# Patient Record
Sex: Male | Born: 1955 | Race: White | Hispanic: No | Marital: Married | Smoking: Current every day smoker
Health system: Southern US, Community
[De-identification: ages and names within clinical notes are randomized; demographics above are authoritative.]

## PROBLEM LIST (undated history)

## (undated) DIAGNOSIS — I679 Cerebrovascular disease, unspecified: Secondary | ICD-10-CM

## (undated) DIAGNOSIS — I639 Cerebral infarction, unspecified: Secondary | ICD-10-CM

## (undated) DIAGNOSIS — E119 Type 2 diabetes mellitus without complications: Secondary | ICD-10-CM

## (undated) DIAGNOSIS — F32A Depression, unspecified: Secondary | ICD-10-CM

## (undated) DIAGNOSIS — G8929 Other chronic pain: Secondary | ICD-10-CM

## (undated) DIAGNOSIS — I509 Heart failure, unspecified: Secondary | ICD-10-CM

## (undated) DIAGNOSIS — F329 Major depressive disorder, single episode, unspecified: Secondary | ICD-10-CM

## (undated) DIAGNOSIS — I10 Essential (primary) hypertension: Secondary | ICD-10-CM

## (undated) DIAGNOSIS — F419 Anxiety disorder, unspecified: Secondary | ICD-10-CM

## (undated) DIAGNOSIS — I2510 Atherosclerotic heart disease of native coronary artery without angina pectoris: Secondary | ICD-10-CM

## (undated) DIAGNOSIS — E785 Hyperlipidemia, unspecified: Secondary | ICD-10-CM

## (undated) DIAGNOSIS — M1990 Unspecified osteoarthritis, unspecified site: Secondary | ICD-10-CM

## (undated) DIAGNOSIS — A4102 Sepsis due to Methicillin resistant Staphylococcus aureus: Secondary | ICD-10-CM

## (undated) DIAGNOSIS — J449 Chronic obstructive pulmonary disease, unspecified: Secondary | ICD-10-CM

## (undated) DIAGNOSIS — K5792 Diverticulitis of intestine, part unspecified, without perforation or abscess without bleeding: Secondary | ICD-10-CM

## (undated) DIAGNOSIS — I428 Other cardiomyopathies: Secondary | ICD-10-CM

## (undated) HISTORY — DX: Sepsis due to methicillin resistant Staphylococcus aureus: A41.02

## (undated) HISTORY — DX: Other chronic pain: G89.29

## (undated) HISTORY — DX: Chronic obstructive pulmonary disease, unspecified: J44.9

## (undated) HISTORY — DX: Essential (primary) hypertension: I10

## (undated) HISTORY — DX: Cerebrovascular disease, unspecified: I67.9

## (undated) HISTORY — DX: Depression, unspecified: F32.A

## (undated) HISTORY — DX: Hyperlipidemia, unspecified: E78.5

## (undated) HISTORY — DX: Type 2 diabetes mellitus without complications: E11.9

## (undated) HISTORY — DX: Atherosclerotic heart disease of native coronary artery without angina pectoris: I25.10

## (undated) HISTORY — DX: Other cardiomyopathies: I42.8

## (undated) HISTORY — DX: Diverticulitis of intestine, part unspecified, without perforation or abscess without bleeding: K57.92

## (undated) HISTORY — DX: Anxiety disorder, unspecified: F41.9

## (undated) HISTORY — DX: Major depressive disorder, single episode, unspecified: F32.9

---

## 2001-06-20 ENCOUNTER — Encounter: Payer: Self-pay | Admitting: Family Medicine

## 2001-06-20 ENCOUNTER — Ambulatory Visit (HOSPITAL_COMMUNITY): Admission: RE | Admit: 2001-06-20 | Discharge: 2001-06-20 | Payer: Self-pay | Admitting: Family Medicine

## 2002-10-10 ENCOUNTER — Ambulatory Visit (HOSPITAL_COMMUNITY): Admission: RE | Admit: 2002-10-10 | Discharge: 2002-10-10 | Payer: Self-pay | Admitting: Family Medicine

## 2002-10-10 ENCOUNTER — Encounter: Payer: Self-pay | Admitting: Family Medicine

## 2002-11-21 HISTORY — PX: ABDOMINAL SURGERY: SHX537

## 2003-01-27 ENCOUNTER — Inpatient Hospital Stay (HOSPITAL_COMMUNITY): Admission: AD | Admit: 2003-01-27 | Discharge: 2003-01-30 | Payer: Self-pay | Admitting: Family Medicine

## 2003-01-27 ENCOUNTER — Encounter: Payer: Self-pay | Admitting: Family Medicine

## 2003-04-10 ENCOUNTER — Encounter: Payer: Self-pay | Admitting: General Surgery

## 2003-04-10 ENCOUNTER — Ambulatory Visit (HOSPITAL_COMMUNITY): Admission: RE | Admit: 2003-04-10 | Discharge: 2003-04-10 | Payer: Self-pay | Admitting: General Surgery

## 2003-05-06 ENCOUNTER — Inpatient Hospital Stay (HOSPITAL_COMMUNITY): Admission: AD | Admit: 2003-05-06 | Discharge: 2003-05-16 | Payer: Self-pay | Admitting: General Surgery

## 2003-05-07 ENCOUNTER — Encounter: Payer: Self-pay | Admitting: Anesthesiology

## 2003-05-09 ENCOUNTER — Encounter: Payer: Self-pay | Admitting: General Surgery

## 2003-05-12 ENCOUNTER — Encounter: Payer: Self-pay | Admitting: General Surgery

## 2003-05-15 ENCOUNTER — Encounter: Payer: Self-pay | Admitting: General Surgery

## 2003-08-04 ENCOUNTER — Observation Stay (HOSPITAL_COMMUNITY): Admission: AD | Admit: 2003-08-04 | Discharge: 2003-08-05 | Payer: Self-pay | Admitting: General Surgery

## 2003-08-06 ENCOUNTER — Encounter (HOSPITAL_COMMUNITY): Admission: RE | Admit: 2003-08-06 | Discharge: 2003-08-21 | Payer: Self-pay | Admitting: General Surgery

## 2003-09-10 ENCOUNTER — Ambulatory Visit (HOSPITAL_COMMUNITY): Admission: RE | Admit: 2003-09-10 | Discharge: 2003-09-10 | Payer: Self-pay | Admitting: Family Medicine

## 2003-09-10 ENCOUNTER — Encounter: Payer: Self-pay | Admitting: Family Medicine

## 2003-11-22 HISTORY — PX: OTHER SURGICAL HISTORY: SHX169

## 2003-11-22 HISTORY — PX: CARDIAC DEFIBRILLATOR PLACEMENT: SHX171

## 2004-02-11 ENCOUNTER — Ambulatory Visit (HOSPITAL_COMMUNITY): Admission: RE | Admit: 2004-02-11 | Discharge: 2004-02-11 | Payer: Self-pay | Admitting: Internal Medicine

## 2004-04-06 ENCOUNTER — Inpatient Hospital Stay (HOSPITAL_COMMUNITY): Admission: AD | Admit: 2004-04-06 | Discharge: 2004-04-14 | Payer: Self-pay | Admitting: Family Medicine

## 2004-04-15 ENCOUNTER — Encounter (HOSPITAL_COMMUNITY): Admission: RE | Admit: 2004-04-15 | Discharge: 2004-05-15 | Payer: Self-pay | Admitting: Family Medicine

## 2004-06-24 ENCOUNTER — Encounter (HOSPITAL_COMMUNITY): Admission: RE | Admit: 2004-06-24 | Discharge: 2004-07-24 | Payer: Self-pay | Admitting: *Deleted

## 2004-07-28 ENCOUNTER — Encounter (HOSPITAL_COMMUNITY): Admission: RE | Admit: 2004-07-28 | Discharge: 2004-08-20 | Payer: Self-pay | Admitting: *Deleted

## 2004-10-28 ENCOUNTER — Ambulatory Visit (HOSPITAL_COMMUNITY): Admission: RE | Admit: 2004-10-28 | Discharge: 2004-10-28 | Payer: Self-pay | Admitting: Family Medicine

## 2004-11-04 ENCOUNTER — Inpatient Hospital Stay (HOSPITAL_COMMUNITY): Admission: RE | Admit: 2004-11-04 | Discharge: 2004-11-05 | Payer: Self-pay | Admitting: *Deleted

## 2005-09-16 ENCOUNTER — Ambulatory Visit (HOSPITAL_COMMUNITY): Admission: RE | Admit: 2005-09-16 | Discharge: 2005-09-16 | Payer: Self-pay | Admitting: *Deleted

## 2008-11-21 HISTORY — PX: OTHER SURGICAL HISTORY: SHX169

## 2009-01-05 ENCOUNTER — Ambulatory Visit (HOSPITAL_COMMUNITY): Admission: RE | Admit: 2009-01-05 | Discharge: 2009-01-05 | Payer: Self-pay | Admitting: Family Medicine

## 2009-04-08 ENCOUNTER — Emergency Department (HOSPITAL_COMMUNITY): Admission: EM | Admit: 2009-04-08 | Discharge: 2009-04-08 | Payer: Self-pay | Admitting: Emergency Medicine

## 2009-04-16 ENCOUNTER — Ambulatory Visit: Payer: Self-pay | Admitting: Internal Medicine

## 2009-04-16 ENCOUNTER — Inpatient Hospital Stay (HOSPITAL_COMMUNITY): Admission: EM | Admit: 2009-04-16 | Discharge: 2009-04-20 | Payer: Self-pay | Admitting: Emergency Medicine

## 2009-04-17 ENCOUNTER — Ambulatory Visit: Payer: Self-pay | Admitting: Internal Medicine

## 2009-04-19 ENCOUNTER — Ambulatory Visit: Payer: Self-pay | Admitting: Gastroenterology

## 2009-04-22 ENCOUNTER — Encounter: Payer: Self-pay | Admitting: Internal Medicine

## 2009-09-21 ENCOUNTER — Ambulatory Visit (HOSPITAL_COMMUNITY): Admission: RE | Admit: 2009-09-21 | Discharge: 2009-09-21 | Payer: Self-pay | Admitting: Family Medicine

## 2009-09-23 ENCOUNTER — Inpatient Hospital Stay (HOSPITAL_COMMUNITY): Admission: EM | Admit: 2009-09-23 | Discharge: 2009-09-24 | Payer: Self-pay | Admitting: Emergency Medicine

## 2009-09-25 ENCOUNTER — Encounter (HOSPITAL_COMMUNITY): Admission: RE | Admit: 2009-09-25 | Discharge: 2009-10-25 | Payer: Self-pay | Admitting: Family Medicine

## 2009-09-27 ENCOUNTER — Inpatient Hospital Stay (HOSPITAL_COMMUNITY): Admission: EM | Admit: 2009-09-27 | Discharge: 2009-10-05 | Payer: Self-pay | Admitting: Emergency Medicine

## 2009-09-28 ENCOUNTER — Encounter (INDEPENDENT_AMBULATORY_CARE_PROVIDER_SITE_OTHER): Payer: Self-pay | Admitting: Internal Medicine

## 2009-09-28 ENCOUNTER — Ambulatory Visit: Payer: Self-pay | Admitting: Physical Medicine & Rehabilitation

## 2009-09-30 ENCOUNTER — Encounter (INDEPENDENT_AMBULATORY_CARE_PROVIDER_SITE_OTHER): Payer: Self-pay | Admitting: Neurology

## 2009-10-12 ENCOUNTER — Encounter (INDEPENDENT_AMBULATORY_CARE_PROVIDER_SITE_OTHER): Payer: Self-pay | Admitting: *Deleted

## 2009-10-12 LAB — CONVERTED CEMR LAB
ALT: 12 units/L
Cholesterol: 80 mg/dL
Creatinine, Ser: 1.07 mg/dL
HDL: 22 mg/dL
LDL Cholesterol: 17 mg/dL
Triglycerides: 203 mg/dL

## 2009-11-21 HISTORY — PX: OTHER SURGICAL HISTORY: SHX169

## 2009-12-14 ENCOUNTER — Encounter: Payer: Self-pay | Admitting: Orthopedic Surgery

## 2009-12-14 ENCOUNTER — Emergency Department (HOSPITAL_COMMUNITY): Admission: EM | Admit: 2009-12-14 | Discharge: 2009-12-14 | Payer: Self-pay | Admitting: Emergency Medicine

## 2009-12-22 ENCOUNTER — Ambulatory Visit: Payer: Self-pay | Admitting: Orthopedic Surgery

## 2009-12-22 DIAGNOSIS — I69959 Hemiplegia and hemiparesis following unspecified cerebrovascular disease affecting unspecified side: Secondary | ICD-10-CM | POA: Insufficient documentation

## 2009-12-22 DIAGNOSIS — M7580 Other shoulder lesions, unspecified shoulder: Secondary | ICD-10-CM

## 2009-12-22 DIAGNOSIS — M25519 Pain in unspecified shoulder: Secondary | ICD-10-CM

## 2009-12-28 ENCOUNTER — Ambulatory Visit: Admission: RE | Admit: 2009-12-28 | Discharge: 2009-12-28 | Payer: Self-pay | Admitting: Orthopedic Surgery

## 2009-12-28 ENCOUNTER — Encounter: Payer: Self-pay | Admitting: Orthopedic Surgery

## 2009-12-31 ENCOUNTER — Encounter (HOSPITAL_COMMUNITY): Admission: RE | Admit: 2009-12-31 | Discharge: 2010-01-30 | Payer: Self-pay | Admitting: Family Medicine

## 2010-02-01 ENCOUNTER — Encounter (HOSPITAL_COMMUNITY): Admission: RE | Admit: 2010-02-01 | Discharge: 2010-03-03 | Payer: Self-pay | Admitting: Family Medicine

## 2010-02-09 ENCOUNTER — Emergency Department (HOSPITAL_COMMUNITY): Admission: EM | Admit: 2010-02-09 | Discharge: 2010-02-09 | Payer: Self-pay | Admitting: Emergency Medicine

## 2010-02-17 ENCOUNTER — Ambulatory Visit (HOSPITAL_COMMUNITY): Admission: RE | Admit: 2010-02-17 | Discharge: 2010-02-17 | Payer: Self-pay | Admitting: Orthopedic Surgery

## 2010-03-26 ENCOUNTER — Ambulatory Visit: Payer: Self-pay | Admitting: Cardiology

## 2010-03-26 DIAGNOSIS — E782 Mixed hyperlipidemia: Secondary | ICD-10-CM

## 2010-03-26 DIAGNOSIS — I429 Cardiomyopathy, unspecified: Secondary | ICD-10-CM | POA: Insufficient documentation

## 2010-03-26 DIAGNOSIS — I2510 Atherosclerotic heart disease of native coronary artery without angina pectoris: Secondary | ICD-10-CM | POA: Insufficient documentation

## 2010-03-31 ENCOUNTER — Ambulatory Visit: Payer: Self-pay | Admitting: Cardiology

## 2010-03-31 ENCOUNTER — Ambulatory Visit (HOSPITAL_COMMUNITY): Admission: RE | Admit: 2010-03-31 | Discharge: 2010-03-31 | Payer: Self-pay | Admitting: Cardiology

## 2010-03-31 ENCOUNTER — Encounter: Payer: Self-pay | Admitting: Cardiology

## 2010-04-02 ENCOUNTER — Encounter: Payer: Self-pay | Admitting: Internal Medicine

## 2010-04-02 ENCOUNTER — Telehealth (INDEPENDENT_AMBULATORY_CARE_PROVIDER_SITE_OTHER): Payer: Self-pay | Admitting: *Deleted

## 2010-04-05 ENCOUNTER — Encounter: Payer: Self-pay | Admitting: Cardiology

## 2010-04-05 ENCOUNTER — Encounter: Payer: Self-pay | Admitting: Orthopedic Surgery

## 2010-04-05 ENCOUNTER — Encounter (INDEPENDENT_AMBULATORY_CARE_PROVIDER_SITE_OTHER): Payer: Self-pay | Admitting: *Deleted

## 2010-04-06 ENCOUNTER — Encounter (INDEPENDENT_AMBULATORY_CARE_PROVIDER_SITE_OTHER): Payer: Self-pay | Admitting: *Deleted

## 2010-04-07 ENCOUNTER — Ambulatory Visit: Payer: Self-pay | Admitting: Internal Medicine

## 2010-04-07 ENCOUNTER — Encounter (INDEPENDENT_AMBULATORY_CARE_PROVIDER_SITE_OTHER): Payer: Self-pay | Admitting: *Deleted

## 2010-04-07 DIAGNOSIS — Z95810 Presence of automatic (implantable) cardiac defibrillator: Secondary | ICD-10-CM | POA: Insufficient documentation

## 2010-05-14 ENCOUNTER — Ambulatory Visit (HOSPITAL_COMMUNITY)
Admission: RE | Admit: 2010-05-14 | Discharge: 2010-05-15 | Payer: Self-pay | Source: Home / Self Care | Admitting: Specialist

## 2010-06-23 ENCOUNTER — Encounter (INDEPENDENT_AMBULATORY_CARE_PROVIDER_SITE_OTHER): Payer: Self-pay | Admitting: *Deleted

## 2010-07-16 ENCOUNTER — Ambulatory Visit: Payer: Self-pay | Admitting: Cardiology

## 2010-07-16 DIAGNOSIS — I10 Essential (primary) hypertension: Secondary | ICD-10-CM

## 2010-07-22 ENCOUNTER — Encounter: Payer: Self-pay | Admitting: Internal Medicine

## 2010-07-22 ENCOUNTER — Ambulatory Visit: Payer: Self-pay | Admitting: Cardiology

## 2010-11-03 ENCOUNTER — Ambulatory Visit: Payer: Self-pay | Admitting: Cardiology

## 2010-11-05 ENCOUNTER — Encounter (INDEPENDENT_AMBULATORY_CARE_PROVIDER_SITE_OTHER): Payer: Self-pay | Admitting: *Deleted

## 2010-11-05 ENCOUNTER — Ambulatory Visit: Payer: Self-pay | Admitting: Internal Medicine

## 2010-11-09 ENCOUNTER — Inpatient Hospital Stay (HOSPITAL_COMMUNITY): Admission: EM | Admit: 2010-11-09 | Discharge: 2010-11-10 | Payer: Self-pay | Source: Home / Self Care

## 2010-11-21 ENCOUNTER — Emergency Department (HOSPITAL_COMMUNITY)
Admission: EM | Admit: 2010-11-21 | Discharge: 2010-11-21 | Payer: Self-pay | Source: Home / Self Care | Admitting: Emergency Medicine

## 2010-12-06 ENCOUNTER — Ambulatory Visit
Admission: RE | Admit: 2010-12-06 | Discharge: 2010-12-06 | Payer: Self-pay | Source: Home / Self Care | Attending: Cardiology | Admitting: Cardiology

## 2010-12-06 ENCOUNTER — Encounter (INDEPENDENT_AMBULATORY_CARE_PROVIDER_SITE_OTHER): Payer: Self-pay | Admitting: *Deleted

## 2010-12-08 ENCOUNTER — Encounter (INDEPENDENT_AMBULATORY_CARE_PROVIDER_SITE_OTHER): Payer: Self-pay | Admitting: *Deleted

## 2010-12-21 NOTE — Assessment & Plan Note (Signed)
Summary: per Dr.McGough for pre-surgical evaluation/tg   Visit Type:  Initial Consult Referring Provider:  Dr. Valma Cava Primary Provider:  Dr. Karleen Hampshire   History of Present Illness: 55 year old male referred for cardiology consultation. He is a former patient of [employer], [employer], Dr. Domingo Sep, and has had no regular cardiology followup the last few years it seems. His history is reviewed below. He is here with his wife today for a preoperative consultation prior to elective left rotator cuff surgery, type of anesthesia not yet chosen.  At baseline Peter Long denies any anginal chest pain, and has NYHA class II dyspnea on exertion. 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 He uses a cane for support with his right arm. He has limited mobility of his left arm.  He reports compliance with his medications which are outlined below. He has not had any followup ischemic testing over the last several years, stating that he had an outpatient diagnostic cardiac catheterization done back somewhere around 2005 are 2006 (report not available at this time). He states that at that time he was told he had only "mild plaque."  Most recent assessment of LVEF is by TEE done in November 2010, and reviewed below.  Peter Long also indicates that he has had no regular followup of his St. Jude defibrillator. He states that he has never had a shock from this device.  Clinical Review Panels:  Echocardiogram Echocardiogram   Study Conclusions    1. Left ventricle: Systolic function was normal. The estimated      ejection fraction was in the range of 50% to 55%.   2. Aortic valve: Trivial regurgitation.   3. Mitral valve: Mildly calcified annulus. Mild regurgitation.   4. Left atrium: The atrium was mildly dilated. No evidence of      thrombus in the appendage.   5. Right atrium: The atrium was mildly dilated. No evidence of  thrombus in the atrial cavity or appendage.   6. Atrial septum: The septum bowed from right to left, consistent      with increased right atrial pressure. No defect or patent foramen      ovale was identified. There was an atrial septal aneurysm. Saline      contrast study negative for PFO or ASD. No evidence of thrombus.   Impressions:    - No cardiac source of emboli was indentified.   Transesophageal echocardiography. 2D and color Doppler. Patient   status: Inpatient. Location: Endoscopy. (09/30/2009)    Current Medications (verified): 1)  Oxycodone Hcl 15 Mg Tabs (Oxycodone Hcl) .... Take As Needed 2)  Bupropion Hcl 150 Mg Xr12h-Tab (Bupropion Hcl) .... Take 1 Tab Two Times A Day 3)  Digoxin 0.125 Mg Tabs (Digoxin) .... Take 1 Tab Daily 4)  Simvastatin 40 Mg Tabs (Simvastatin) .... Take 1 Tab Daily 5)  Gemfibrozil 600 Mg Tabs (Gemfibrozil) .... Take 1 Tab Two Times A Day 6)  Mirtazapine 15 Mg Tabs (Mirtazapine) .... Take 1 Tab At Bedtime 7)  Lisinopril 10 Mg Tabs (Lisinopril) .... Take 1 Tab Daily 8)  Citalopram Hydrobromide 40 Mg Tabs (Citalopram Hydrobromide) .... Take 1 Tab Daily 9)  Carvedilol 25 Mg Tabs (Carvedilol) .... Take 1 Tab Two Times A Day 10)  Metformin Hcl 1000 Mg Tabs (Metformin Hcl) .... Take 1tab Two Times A Day 11)  Aleve 220 Mg Tabs (Naproxen Sodium) .... Take As Needed 12)  Lantus 100 Unit/ml Soln (Insulin Glargine) .... 72  Units At Bedtime  Allergies (verified): 1)  ! Morphine  Past History:  Past Medical History: Last updated: 03/24/2010 Chronic pain Diverticulitis C O P D Diabetes Type 2 Hyperlipidemia Hypertension Anxiety Depression Cerebrovascular Disease Nonischemic cardiomyopathy - LVEF improved to 50-55% by TEE 11/10 [facility]) MRSA sepsis CAD - possible, poorly documented  Past Surgical History: Last updated: 03/24/2010 Right gluteal abscess drainage 2005 Abdominal surgery 2004 ICD implantation 12/05 [facility] - Dr. Severiano Gilbert), St Jude Atlas  Plus VR ERCP with stone extraction 5/10  Family History: Last updated: 03/24/2010 FH of Cancer Family History of Diabetes Family History Coronary Heart Disease male < 44  Social History: Last updated: 03/24/2010 Patient is married Tobacco Use - Yes Alcohol Use - no  Review of Systems  The patient denies anorexia, fever, weight loss, chest pain, dyspnea on exertion, peripheral edema, prolonged cough, melena, hematochezia, and severe indigestion/heartburn.         Complains of left shoulder pain with reduced range of motion. Also some right shoulder pain using a cane. No dizziness, palpitations, or syncope. Appetite stable. Otherwise reviewed and negative.  Vital Signs:  Patient profile:   55 year old male Height:      72 inches Weight:      222 pounds BMI:     30.22 Pulse rate:   81 / minute BP sitting:   154 / 78  (right arm)  Vitals Entered By: Dreama Saa, CNA (Mar 26, 2010 11:25 AM)  Physical Exam  Additional Exam:  Obese, chronically ill-appearing male, in no acute distress. HEENT: Conjunctiva and lids normal, oropharynx with poor dentition. Neck: Supple, no elevated JVP or loud carotid bruits. Lungs: Diminished breath sounds, non-labored, no wheezing. Cardiac: Regular rate and rhythm, indistinct PMI, no S3 gallop or loud systolic murmur. Thorax: Well-healed device pocket site on the right. Abdomen: Obese, unable to palpate liver edge, bowel sounds present, no tenderness. Extremities: No pitting edema, distal pulses one plus, some venous stasis. Skin: Warm and dry, scattered tattoos. Musculoskeletal: No gross deformities. Neuropsychiatric: Alert and oriented x3, affect somewhat guarded. Muscle strength 3-4/5 left versus normal on the right.   EKG  Procedure date:  03/26/2010  Findings:      Normal sinus rhythm at 81 beats per minutes with a nonspecific IVCD, QRS duration 122 ms, nonspecific ST changes.  Impression & Recommendations:  Problem # 1:   PREOPERATIVE EXAMINATION (ICD-V72.84)  Preoperative evaluation in a 55 year old male with history of nonischemic cardiomyopathy, subsequent improvement in LVEF is 50-55% as of November 2010, status post prior defibrillator placement for prophylaxis, hypertension, hyperlipidemia, and potentially only mild coronary atherosclerosis based on very limited information. He also has a history of cerebrovascular disease status post stroke with residual left-sided weakness since November 2010. He was previously followed by [facility], although has had no regular cardiology followup for years. He reports no anginal symptoms, and stable NYHA class II dyspnea exertion, with no device discharges, and a nonspecific resting electrocardiogram. I have requested records regarding the patient's reported prior outpatient cardiac catheterization. Given the length of time since formal ischemic testing, we will also arrange a Lexiscan Myoview on medical therapy to better understand his perioperative risk. Based on this along with reassessment of LVEF by 2-D echocardiography, we can make a final recommendation regarding his perioperative cardiovascular risk. Otherwise will arrange followup for him routinely in 3 months, and also establish him in our device clinic for followup of his St. Jude defibrillator. No medication changes were made today.  Problem # 2:  CORONARY ATHEROSCLEROSIS NATIVE CORONARY ARTERY (ICD-414.01)  Reportedly mild and nonobstructive based on very limited information. We will request records regarding his prior cardiac catheterization.  His updated medication list for this problem includes:    Lisinopril 10 Mg Tabs (Lisinopril) .Marland Kitchen... Take 1 tab daily    Carvedilol 25 Mg Tabs (Carvedilol) .Marland Kitchen... Take 1 tab two times a day  Problem # 3:  MIXED HYPERLIPIDEMIA (ICD-272.2)  On reasonable therapy, followed by [name].  His updated medication list for this problem includes:     Simvastatin 40 Mg Tabs (Simvastatin) .Marland Kitchen... Take 1 tab daily    Gemfibrozil 600 Mg Tabs (Gemfibrozil) .Marland Kitchen... Take 1 tab two times a day  Problem # 4:  UNSPECIFIED SECONDARY CARDIOMYOPATHY (ICD-425.9)  Presumably nonischemic cardiomyopathy, with improvement in LVEF to the range of 50-55% as of November 2010. This will be reassessed with a repeat 2-D echocardiogram.  His updated medication list for this problem includes:    Digoxin 0.125 Mg Tabs (Digoxin) .Marland Kitchen... Take 1 tab daily    Lisinopril 10 Mg Tabs (Lisinopril) .Marland Kitchen... Take 1 tab daily    Carvedilol 25 Mg Tabs (Carvedilol) .Marland Kitchen... Take 1 tab two times a day  Patient Instructions: 1)  Your physician recommends that you schedule a follow-up appointment in: 3 months 2)  Your physician recommends that you continue on your current medications as directed. Please refer to the Current Medication list given to you today. 3)  Your physician has requested that you have an echocardiogram.  Echocardiography is a painless test that uses sound waves to create images of your heart. It provides your doctor with information about the size and shape of your heart and how well your heart's chambers and valves are working.  This procedure takes approximately one hour. There are no restrictions for this procedure. 4)  Your physician has requested that you have an [employer]  For further information please visit [email or web address]  Please follow instruction sheet, as given. 5)  You have been referred to our EP clinic and will be given appt. before leaving office today.  Appended Document: per Dr.McGough for pre-surgical evaluation/tg Reviewed recent 2D echo and Myoview reports as well as prior records from [facility].  Cardiac catherization in 2005 by Dr. Elsie Lincoln revealed essentially normal coronaries and patient has documented history of nonischemic cardiomyopathy, LVEF ranging from 10% to 50% based on various studies.  Most consistent LVEF in 25-30% range which is  similar to recent 2D echo.  Myoview without an large degree of ischemia.  He should therefore be able to proceed with planned surgery on medical therapy at a moderate perioperative cardiovascular risk.  He is to establish follow-up in our device clinic as well (St. Jude ICD placed by [facility]).  Appended Document: per Dr.McGough for pre-surgical evaluation/tg clearance letter faxed to Dr. Benny Lennert

## 2010-12-21 NOTE — Miscellaneous (Signed)
Summary: Device preload  Clinical Lists Changes  Observations: Added new observation of ICD INDICATN: CM (04/02/2010 14:10) Added new observation of ICDLEADSTAT2: active (04/02/2010 14:10) Added new observation of ICDLEADSER2: [id number] (04/02/2010 14:10) Added new observation of ICDLEADMOD2: 1570  (04/02/2010 14:10) Added new observation of ICDLEADDOI2: 11/04/2004  (04/02/2010 14:10) Added new observation of ICDLEADLOC2: RV  (04/02/2010 14:10) Added new observation of ICDLEADSTAT1: active  (04/02/2010 14:10) Added new observation of ICDLEADSER1: [id number]  (04/02/2010 14:10) Added new observation of ICDLEADMOD1: 6937  (04/02/2010 14:10) Added new observation of ICDLEADDOI1: 11/04/2004  (04/02/2010 14:10) Added new observation of ICDLEADLOC1: RV  (04/02/2010 14:10) Added new observation of ICD IMP MD: NOT IMPLANTED BY [place]  (04/02/2010 14:10) Added new observation of ICD IMPL DTE: 11/04/2004  (04/02/2010 14:10) Added new observation of ICD SERL#: [id number]  (04/02/2010 14:10) Added new observation of ICD MODL#: V193  (04/02/2010 14:10) Added new observation of ICDMANUFACTR: St Jude  (04/02/2010 14:10) Added new observation of ICD MD: Lewayne Bunting, MD  (04/02/2010 14:10)       ICD Specifications Following MD:  Lewayne Bunting, MD     ICD Vendor:  St Jude     ICD Model Number:  [phone]     ICD Serial Number:  [id number] ICD DOI:  11/04/2004     ICD Implanting MD:  NOT IMPLANTED BY [place]  Lead 1:    Location: RV     DOI: 11/04/2004     Model #: 3875     Serial #: [id number]     Status: active Lead 2:    Location: RV     DOI: 11/04/2004     Model #: 1570     Serial #: [id number]     Status: active  Indications::  CM

## 2010-12-21 NOTE — Assessment & Plan Note (Signed)
Summary: [name] [facility] 12/14/09/REF MCGOUGH/[employer] MEDICARE...   Vital Signs:  Patient profile:   55 year old male Weight:      229 pounds Pulse rate:   68 / minute Resp:     16 per minute  Visit Type:  Initial Consult Primary Provider:  Dr.McGough  CC:  left shoulder pain.  History of Present Illness: Dr. Romeo Apple dictating on Peter Long DR Karleen Hampshire  The patient had a cerebrovascular accident with left-sided paresis in November fell in or on January 21.  He's been having LEFT hip pain LEFT shoulder pain LEFT wrist pain with LEFT wrist swelling and discoloration of the upper extremity   he describes bilateral shoulder pain mostly in the back with some hip pain.  He notes spurs had been seen on x-ray.  His shoulder pain is sharp stabbing constant it started gradually its worse when he lies down nothing makes it better including hydrocodone 10 mg which only lasted 2 minutes and oxycodone.  His pain is severe, he rates it an 8/10  Meds: Hydrocodone 10, Hydromorphone, Diazepam, Lisinopril, Carvedilol, Gemfibrozil, Citalopram, Mirtazapine, Zolpidem, Metformin HCL, Simvastatin, Digoxin, Buspar.  Xrays from 12/14/09 of the left shoulder and wrist for review, [facility].    Allergies (verified): No Known Drug Allergies  Past History:  Past Medical History: chronic pain anxiety depression htn cholesterol trouble sleeping diverticulitis heart disease CHF ICD implant stroke  Past Surgical History: C5 6 and 7 neck hernia MRSA infection  Family History: FH of Cancer:  Family History of Diabetes Family History Coronary Heart Disease male < 20  Social History: Patient is married.  no smoking 1 pack per day of cigs no alcohol no caffeine  Review of Systems General:  Complains of fatigue; denies weight loss, weight gain, fever, and chills; easy bleeding and bruising. Cardiac :  Denies chest pain, angina, heart attack, heart failure, poor circulation,  blood clots, and phlebitis; ICD implant and CHF. Resp:  Complains of short of breath; denies difficulty breathing, COPD, cough, and pneumonia. GI:  Complains of diarrhea; denies nausea, vomiting, constipation, difficulty swallowing, ulcers, GERD, and reflux. GU:  Denies kidney failure, kidney transplant, kidney stones, burning, poor stream, testicular cancer, blood in urine, and . Neuro:  Complains of dizziness, numbness, and unsteady walking; denies headache, migraines, weakness, and tremor. MS:  Complains of joint pain and joint swelling; denies rheumatoid arthritis, gout, bone cancer, osteoporosis, and ; instability and stiffness and muscle pain. Endo:  Denies thyroid disease, goiter, and diabetes. Psych:  Complains of depression and anxiety; denies mood swings, panic attack, bipolar, and schizophrenia. Derm:  Complains of itching; denies eczema and cancer; poor healing. EENT:  Denies poor vision, cataracts, glaucoma, poor hearing, vertigo, ears ringing, sinusitis, hoarseness, toothaches, and bleeding gums; blurred vision and difficulty swallowing. Immunology:  Denies seasonal allergies, sinus problems, and allergic to bee stings. Lymphatic:  Denies lymph node cancer and lymph edema.  Physical Exam  Additional Exam:  vital signs her weight 229 pulse 68 respiratory 16  General he is quite a large male but with a reasonable medium-size body habitus.  He has normal pulse and perfusion in his LEFT upper extremity with good radial and ulnar pulse.  There is edema in the hand.  He has no lymphadenopathy in the axilla cervical spine of the LEFT side.  The skin is warm dry and intact with no discoloration today.  He has normal sensation in the hand and his cervical 5 root cervical 6 root and cervical  7 root all work with equal strength flexing his arm extending his arm and abducting his arm although all are weak.  He had good wrist extension against gravity and moderate extension against  resistance.  Weak finger flexors.  There is tenderness in the anterolateral deltoid area.  There was discomfort with range of motion which was decreased secondary to joint contracture and stiffness.  He had good elbow flexion against gravity and moderate against resistance mild weakness in extension at the elbow.  He had 45 of external rotation 80 of abduction 75 of forward elevation and the shoulder was stable     Impression & Recommendations:  Problem # 1:  IMPINGEMENT SYNDROME (ICD-726.2) Assessment New  Orders: Consultation Level III ([address]) Joint Aspirate / Injection, Large ([address]10) Depo- Medrol 40mg  (J1030)  Problem # 2:  SHOULDER PAIN (ICD-719.41) Assessment: New  I injected his LEFT subacromial space   Verbal consent obtained/The shoulder was injected with depomedrol 40mg /cc and sensorcaine .25% . There were no complications  Most likely he has some mild proximal migration of the humerus due to rotator cuff weakness leading to impingement syndrome and shoulder pain.  I do not see any x-ray evidence of fracture in the shoulder although it had some a.c. joint arthrosis or the wrist although he seems to have some degenerative changes on the ulnar side of the hand.  Recommend wrist splint to maintain functional position of the LEFT hand.  Continue occupational therapy.  He'll discuss his pain management with his primary care physician [name]  Orders: Consultation Level III [id number]) Joint Aspirate / Injection, Large (20610) Depo- Medrol 40mg  (J1030)  Other Orders: Physical Therapy Referral (PT)  Patient Instructions: 1)  Get wrist splint for the left wrist that will hold your wrist up from [place] we will send an order. 2)  Continue at home therapy until you are ready for outpatient therapy. 3)  Talk to PCP about pain med. 4)  No need to wear sling. 5)  come back as needed.

## 2010-12-21 NOTE — Letter (Signed)
Summary: *Orthopedic Consult Note  [facility]  [address] [place] [address]  [place], [place] [address]   Phone: [phone]  Fax: [phone]    Re:    Peter Long DOB:    1956-11-10   Dear: [name]   Thank you for requesting that we see the above patient for consultation.  A copy of the detailed office note will be sent under separate cover, for your review.  Evaluation today is consistent with: impingement syndrome and shoulder pain   Our recommendation is for: injection, functional wrist splint.  (an order for a wrist splint has been sent to the hospital).  Followup as needed       Thank you for this opportunity to look after your patient.  Sincerely,   Terrance Mass. MD.

## 2010-12-21 NOTE — Assessment & Plan Note (Signed)
Summary: NEP ST JUDE   Visit Type:  Initial Consult Referring Provider:  Dr. Valma Cava Primary Provider:  Dr. Karleen Hampshire  CC:  NO CARDIOLOGY COMPLAINTS.  History of Present Illness: Peter Long is referred today as a new EP eval. by Dr. Diona Browner.  He is a pleasant 55 yo man with a h/o DCM and CHF.  He underwent ICD implant in 2005.  He initially had improvement of his LV function but most recently 2D echo demonstrates an EF of 30%.  He has class 2 CHF.  He is mostly bothered by arthritic problems with his shoulder, knees and back and takes pain meds chronically.  He has not had any ICD shocks.  No syncope.  Current Medications (verified): 1)  Oxycodone Hcl 15 Mg Tabs (Oxycodone Hcl) .... Take As Needed 2)  Bupropion Hcl 150 Mg Xr12h-Tab (Bupropion Hcl) .... Take 1 Tab Two Times A Day 3)  Digoxin 0.125 Mg Tabs (Digoxin) .... Take 1 Tab Daily 4)  Simvastatin 40 Mg Tabs (Simvastatin) .... Take 1 Tab Daily 5)  Gemfibrozil 600 Mg Tabs (Gemfibrozil) .... Take 1 Tab Two Times A Day 6)  Mirtazapine 15 Mg Tabs (Mirtazapine) .... Take 1 Tab At Bedtime 7)  Lisinopril 10 Mg Tabs (Lisinopril) .... Take 1 Tab Daily 8)  Citalopram Hydrobromide 40 Mg Tabs (Citalopram Hydrobromide) .... Take 1 Tab Daily 9)  Carvedilol 25 Mg Tabs (Carvedilol) .... Take 1 Tab Two Times A Day 10)  Metformin Hcl 1000 Mg Tabs (Metformin Hcl) .... Take 1tab Two Times A Day 11)  Aleve 220 Mg Tabs (Naproxen Sodium) .... Take As Needed 12)  Lantus 100 Unit/ml Soln (Insulin Glargine) .... 72 Units At Bedtime  Allergies (verified): 1)  ! Morphine  Past History:  Past Medical History: Last updated: 03/24/2010 Chronic pain Diverticulitis C O P D Diabetes Type 2 Hyperlipidemia Hypertension Anxiety Depression Cerebrovascular Disease Nonischemic cardiomyopathy - LVEF improved to 50-55% by TEE 11/10 [facility]) MRSA sepsis CAD - possible, poorly documented  Past Surgical History: Last updated: 03/24/2010 Right  gluteal abscess drainage 2005 Abdominal surgery 2004 ICD implantation 12/05 [facility] - Dr. Severiano Gilbert), St Jude Atlas Plus VR ERCP with stone extraction 5/10  Family History: Last updated: 03/24/2010 FH of Cancer Family History of Diabetes Family History Coronary Heart Disease male < 78  Social History: Last updated: 03/24/2010 Patient is married Tobacco Use - Yes Alcohol Use - no  Review of Systems  The patient denies chest pain, syncope, dyspnea on exertion, and peripheral edema.    Vital Signs:  Patient profile:   55 year old male Weight:      222 pounds Pulse rate:   78 / minute BP sitting:   142 / 97  (right arm)  Vitals Entered By: Dreama Saa, CNA (Apr 07, 2010 3:46 PM)  Physical Exam  General:  Well developed, well nourished, in no acute distress. Ill appearing. HEENT: normal Neck: supple. No JVD. Carotids 2+ bilaterally no bruits Cor: RRR with normal S1 and S2.  PMI is enlarged and laterally displaced. Lungs: CTA with no wheezes or rales. Ab: soft, nontender. nondistended. No HSM. Good bowel sounds Ext: warm. no cyanosis, clubbing or edema Neuro: alert and oriented. Grossly nonfocal. affect pleasant     ICD Specifications Following MD:  Lewayne Bunting, MD     ICD Vendor:  St Jude     ICD Model Number:  [id number]     ICD Serial Number:  [id number] ICD DOI:  11/04/2004  ICD Implanting MD:  NOT IMPLANTED BY [place]  Lead 1:    Location: RV     DOI: 11/04/2004     Model #: 1610     Serial #: [id number]     Status: active Lead 2:    Location: RV     DOI: 11/04/2004     Model #: 1570     Serial #: [id number]     Status: active  Indications::  CM   ICD Follow Up Remote Check?  No Battery Voltage:  2.62 V     Charge Time:  11.1 seconds     Underlying rhythm:  SR   ICD Device Measurements Right Ventricle:  Amplitude: 12 mV, Impedance: 450 ohms, Threshold: 0.75 V at 0.5 msec  Episodes Coumadin:  No Shock:  0     ATP:  0     Nonsustained:  0     Ventricular Pacing:   <1%  Brady Parameters Mode vvi     Lower Rate Limit:  40      Tachy Zones VF:  182     Next Cardiology Appt Due:  06/21/2010 Tech Comments:  No parameter changes.  Device function normal.  ROV 3 months RDS clinic. Altha Harm, LPN  Apr 07, 2010 4:06 PM  MD Comments:  Agree with above.  Impression & Recommendations:  Problem # 1:  AUTOMATIC IMPLANTABLE CARDIAC DEFIBRILLATOR SITU (ICD-V45.02) His device is working normally.  I will recheck in several months.  Problem # 2:  UNSPECIFIED SECONDARY CARDIOMYOPATHY (ICD-425.9) His CHF is well compensated.  I have asked him to maintain a low sodium diet. He will continue his current meds. His updated medication list for this problem includes:    Digoxin 0.125 Mg Tabs (Digoxin) .Marland Kitchen... Take 1 tab daily    Lisinopril 10 Mg Tabs (Lisinopril) .Marland Kitchen... Take 1 tab daily    Carvedilol 25 Mg Tabs (Carvedilol) .Marland Kitchen... Take 1 tab two times a day  Problem # 3:  PREOPERATIVE EXAMINATION (ICD-V72.84) The patient is considering undergoing surgery.  He would be low-moderate risk for cardiovascular complications.  Patient Instructions: 1)  Your physician recommends that you schedule a follow-up appointment in: 1 year 2)  Your physician recommends that you continue on your current medications as directed. Please refer to the Current Medication list given to you today.

## 2010-12-21 NOTE — Cardiovascular Report (Signed)
Summary: Office Visit   Office Visit   Imported By: Roderic Ovens 08/17/2010 15:26:16  _____________________________________________________________________  External Attachment:    Type:   Image     Comment:   External Document

## 2010-12-21 NOTE — Letter (Signed)
Summary: [place] [facility] (Nuc Med Stress)  [facility]  [address] [place] [address]   Phone: [phone]  Fax: [phone]    Nuclear Medicine 1-Day Stress Test Information Sheet  Re:     Peter Long   DOB:     1955-12-26 MRN:     [id number] Weight:  Appointment Date: Register at: Appointment Time: Referring MD:  ___Exercise Stress  __Adenosine   __Dobutamine  _X_Lexiscan  __Persantine   __Thallium  Urgency: ____1 (next day)   ____2 (one week)    ____3 (PRN)  Patient will receive Follow Up call with results: Patient needs follow-up appointment:  Instructions regarding medication:  How to prepare for your stress test: 1. DO NOT eat or dring 6 hours prior to your arrival time. This includes no caffeine (coffee, tea, sodas, chocolate) if you were instructed to take your medications, drink water with it. 2. DO NOT use any tobacco products for at leaset 8 hours prior to arrival. 3. DO NOT wear dresses or any clothing that may have metal clasps or buttons. 4. Wear short sleeve shirts, loose clothing, and comfortalbe walking shoes. 5. DO NOT use lotions, oils or powder on your chest before the test. 6. The test will take approximately 3-4 hours from the time you arrive until completion. 7. To register the day of the test, go to the Short Stay entrance at [facility] 8. If you must cancel your test, call [phone] as soon as you are aware.  After you arrive for test:   When you arrive at [facility], you will go to Short Stay to be registered. They will then send you to Radiology to check in. The Nuclear Medicine Tech will get you and start an IV in your arm or hand. A small amount of a radioactive tracer will then be injected into your IV. This tracer will then have to circulate for 30-45 minutes. During this time you will wait in the waiting room and you will be able to drink something without caffeine. A series of pictures will be taken  of your heart follwoing this waiting period. After the 1st set of pictures you will go to the stress lab to get ready for your stress test. During the stress test, another small amount of a radioactive tracer will be injected through your IV. When the stress test is complete, there is a short rest period while your heart rate and blood pressure will be monitored. When this monitoring period is complete you will have another set of pictrues taken. (The same as the 1st set of pictures). These pictures are taken between 15 minutes and 1 hour after the stress test. The time depends on the type of stress test you had. Your doctor will inform you of your test results within 7 days after test.    The possibilities of certain changes are possible during the test. They include abnormal blood pressure and disorders of the heart. Side effects of persantine or adenosine can include flushing, chest pain, shortness of breath, stomach tightness, headache and light-headedness. These side effects usually do not last long and are self-resolving. Every effort will be made to keep you comfortable and to minimize complications by obtaining a medical history and by close observation during the test. Emergency equipment, medications, and trained personnel are available to deal with any unusual situation which may arise.  Please notify office at least 48 hours in advance if you are unable to keep  this appt.

## 2010-12-21 NOTE — Letter (Signed)
Summary: MED LIST  MED LIST   Imported By: Faythe Ghee 03/26/2010 15:49:57  _____________________________________________________________________  External Attachment:    Type:   Image     Comment:   External Document

## 2010-12-21 NOTE — Progress Notes (Signed)
Summary: Results  Phone Note Call from Patient   Caller: Patient Summary of Call: Pt would like to speak with you regarding results of stress test and if he's cleared for surgery/tg Initial call taken by: Raechel Ache [facility],  Apr 02, 2010 11:06 AM  Follow-up for Phone Call        health port [phone] I spoke with health port late Friday afternoon, His records were mailed Friday to our office.   Teressa Lower RN  Apr 05, 2010 9:56 AM   Follow-up by: Teressa Lower RN,  Apr 02, 2010 12:28 PM

## 2010-12-21 NOTE — Miscellaneous (Signed)
Summary: OT Clinical evaluation  OT Clinical evaluation   Imported By: Jacklynn Ganong 01/25/2010 10:35:11  _____________________________________________________________________  External Attachment:    Type:   Image     Comment:   External Document

## 2010-12-21 NOTE — Assessment & Plan Note (Signed)
Summary: F3M   Visit Type:  Follow-up Primary Provider:  Dr. Karleen Hampshire   History of Present Illness: 55 year old male presents for followup. I saw him in consultation back in May for preoperative clearance. History is detailed in that note. In the meanwhile I had him establish with Dr. Ladona Ridgel for device followup. He ultimately underwent arthroscopic left shoulder surgery in June.  He reports NYHA class II dyspnea on exertion, no chest pain or palpitations. States of compliance with his medications. Blood pressure is elevated today, although he said he got upset about misplacing some money shortly before coming to the office.  I reviewed his cardiac testing, detailed below. Plan at this point is for medical therapy and observation.  Current Medications (verified): 1)  Bupropion Hcl 150 Mg Xr12h-Tab (Bupropion Hcl) .... Take 1 Tab Two Times A Day 2)  Digoxin 0.125 Mg Tabs (Digoxin) .... Take 1 Tab Daily 3)  Simvastatin 40 Mg Tabs (Simvastatin) .... Take 1 Tab Daily 4)  Gemfibrozil 600 Mg Tabs (Gemfibrozil) .... Take 1 Tab Two Times A Day 5)  Mirtazapine 15 Mg Tabs (Mirtazapine) .... Take 1 Tab At Bedtime 6)  Lisinopril 10 Mg Tabs (Lisinopril) .... Take 1 Tab Daily 7)  Citalopram Hydrobromide 40 Mg Tabs (Citalopram Hydrobromide) .... Take 1 Tab Daily 8)  Carvedilol 25 Mg Tabs (Carvedilol) .... Take 1 Tab Two Times A Day 9)  Metformin Hcl 1000 Mg Tabs (Metformin Hcl) .... Take 1tab Two Times A Day 10)  Aleve 220 Mg Tabs (Naproxen Sodium) .... Take As Needed 11)  Lantus 100 Unit/ml Soln (Insulin Glargine) .... 72 Units At Bedtime 12)  Endocet 10-325 Mg Tabs (Oxycodone-Acetaminophen) .... Take As Needed  Allergies (verified): 1)  ! Morphine  Past History:  Social History: Last updated: 03/24/2010 Patient is married Tobacco Use - Yes Alcohol Use - no  Past Medical History: Chronic pain Diverticulitis C O P D Diabetes Type  2 Hyperlipidemia Hypertension Anxiety Depression Cerebrovascular Disease Nonischemic cardiomyopathy - LVEF 30-35% 5/11 MRSA sepsis CAD - possible, poorly documented  Past Surgical History: Right gluteal abscess drainage 2005 Abdominal surgery 2004 ICD implantation 12/05 [facility] - Dr. Severiano Gilbert), St Jude Atlas Plus VR ERCP with stone extraction 5/10 Arthroscopic left shoulder surgery 6/11  Review of Systems  The patient denies anorexia, fever, chest pain, syncope, dyspnea on exertion, peripheral edema, melena, and hematochezia.         Complains of chronic left-sided weakness and also left-sided hip pain. Uses a cane to ambulate. Otherwise reviewed and negative.  Vital Signs:  Patient profile:   55 year old male Height:      72 inches Weight:      220 pounds O2 Sat:      95 % on Room air Pulse rate:   91 / minute BP sitting:   157 / 96  (right arm)  Vitals Entered By: Dreama Saa, CNA (July 16, 2010 1:31 PM)  O2 Flow:  Room air  Physical Exam  Additional Exam:  Obese, chronically ill-appearing male, in no acute distress. HEENT: Conjunctiva and lids normal, oropharynx with poor dentition. Neck: Supple, no elevated JVP or loud carotid bruits. Lungs: Diminished breath sounds, non-labored, no wheezing. Cardiac: Regular rate and rhythm, indistinct PMI, no S3 gallop or loud systolic murmur. Thorax: Well-healed device pocket site on the right. Abdomen: Obese, unable to palpate liver edge, bowel sounds present, no tenderness. Extremities: No pitting edema, distal pulses one plus, some venous stasis. Neuropsychiatric: Alert and oriented x3, affect somewhat guarded.  Muscle strength 3-4/5 left versus normal on the right.   Nuclear Study  Procedure date:  03/31/2010  Findings:      IMPRESSION:   Abnormal Lexiscan Myoview, poor quality study overall however.   There were no diagnostic ST-segment changes by standard criteria.   Limitations of the study are related to soft  tissue attenuation,   poor radiotracer uptake within the myocardium in general, and arms   down imaging.  Fixed defects as described above were noted, however   there are no obvious large reversible perfusion defects to indicate   ischemia.  LVEF calculated at 27%, although appears to be   underestimated.  Left ventricular chamber size is enlarged based on   measurements.  This would be consistent with cardiomyopathy.   Cannot exclude scar from prior infarct based on this particular   study however.  Review of additional records may be of use.   Echocardiogram  Procedure date:  03/31/2010  Findings:       Study Conclusions    - Left ventricle: The cavity size was mildly dilated. Wall thickness     was increased in a pattern of mild LVH. Systolic function was     moderately to severely reduced. The estimated ejection fraction     was in the range of 30% to 35%. Diffuse hypokinesis. Probable     akinesis of the lateral myocardium. Doppler parameters are     consistent with abnormal left ventricular relaxation (grade 1     diastolic dysfunction).   - Aortic valve: Mildly thickened leaflets.   - Mitral valve: Mild regurgitation.   - Left atrium: The atrium was at the upper limits of normal in size.   - Right ventricle: Pacer wire or catheter noted in right ventricle.     Systolic function was normal.   - Tricuspid valve: Trivial regurgitation.   - Pericardium, extracardiac: A trivial pericardial effusion was     identified.   ICD Specifications Following MD:  Lewayne Bunting, MD     ICD Vendor:  [facility] Jude     ICD Model Number:  [phone]     ICD Serial Number:  [id number] ICD DOI:  11/04/2004     ICD Implanting MD:  NOT IMPLANTED BY [place]  Lead 1:    Location: RV     DOI: 11/04/2004     Model #: 2841     Serial #: [id number]     Status: active Lead 2:    Location: RV     DOI: 11/04/2004     Model #: 1570     Serial #: [id number]     Status: active  Indications::  CM   Episodes Coumadin:   No  Brady Parameters Mode vvi     Lower Rate Limit:  40      Tachy Zones VF:  182     Impression & Recommendations:  Problem # 1:  UNSPECIFIED SECONDARY CARDIOMYOPATHY (ICD-425.9)  History of nonischemic cardiomyopathy. Most recent Myoview is detailed above. At this point plan medical therapy. Patient appears euvolemic on examination. Followup in 4 months.  His updated medication list for this problem includes:    Digoxin 0.125 Mg Tabs (Digoxin) .Marland Kitchen... Take 1 tab daily    Lisinopril 10 Mg Tabs (Lisinopril) .Marland Kitchen... Take 1 tab daily    Carvedilol 25 Mg Tabs (Carvedilol) .Marland Kitchen... Take 1 tab two times a day  Problem # 2:  MIXED HYPERLIPIDEMIA (ICD-272.2)  Followed by [name]. Patient states he is due  for followup lab work with him. Lipid numbers from November last year are reviewed. Would suggest cutting simvastatin back to 10 mg daily. LDL was 17 at that time. AST and ALT normal.  Problem # 3:  AUTOMATIC IMPLANTABLE CARDIAC DEFIBRILLATOR SITU (ICD-V45.02)  Now followed in our device clinic by Dr. Ladona Ridgel. Normal function. No device discharges.  Problem # 4:  ESSENTIAL HYPERTENSION, BENIGN (ICD-401.1)  Blood pressure elevated today. I discussed this with the patient. He will likely need further up titration in therapy. Continue to followup with [name].  His updated medication list for this problem includes:    Lisinopril 10 Mg Tabs (Lisinopril) .Marland Kitchen... Take 1 tab daily    Carvedilol 25 Mg Tabs (Carvedilol) .Marland Kitchen... Take 1 tab two times a day  Patient Instructions: 1)  Your physician recommends that you schedule a follow-up appointment in: 4 months 2)  Your physician recommends that you continue on your current medications as directed. Please refer to the Current Medication list given to you today.

## 2010-12-21 NOTE — Letter (Signed)
Summary: History form  History form   Imported By: Jacklynn Ganong 12/24/2009 16:30:54  _____________________________________________________________________  External Attachment:    Type:   Image     Comment:   External Document

## 2010-12-21 NOTE — Letter (Signed)
Summary: PROGRESS NOTE  PROGRESS NOTE   Imported By: Faythe Ghee 03/26/2010 15:49:30  _____________________________________________________________________  External Attachment:    Type:   Image     Comment:   External Document

## 2010-12-21 NOTE — Cardiovascular Report (Signed)
Summary: [facility]   [facility]   Imported By: Roderic Ovens 04/23/2010 15:45:37  _____________________________________________________________________  External Attachment:    Type:   Image     Comment:   External Document

## 2010-12-21 NOTE — Miscellaneous (Signed)
Summary: LABS LIPID,LIVER,A1C,10/12/2009  Clinical Lists Changes  Observations: Added new observation of ALBUMIN: 4.0 g/dL (62/95/2841 32:44) Added new observation of PROTEIN, TOT: 6.2 g/dL (11/23/7251 66:44) Added new observation of SGPT (ALT): 12 units/L (10/12/2009 16:33) Added new observation of SGOT (AST): 13 units/L (10/12/2009 16:33) Added new observation of ALK PHOS: 70 units/L (10/12/2009 16:33) Added new observation of BILI DIRECT: 0.1 mg/dL (03/47/4259 56:38) Added new observation of CREATININE: 1.07 mg/dL (75/64/3329 51:88) Added new observation of LDL: 17 mg/dL (41/66/0630 16:01) Added new observation of HDL: 22 mg/dL (09/32/3557 32:20) Added new observation of TRIGLYC TOT: 203 mg/dL (25/42/7062 37:62) Added new observation of CHOLESTEROL: 80 mg/dL (83/15/1761 60:73) Added new observation of HGBA1C: 8.9 % (10/12/2009 16:33)

## 2010-12-21 NOTE — Letter (Signed)
Summary: Clearance Letter  [facility]  [address], [place] [address]   Phone: [phone]  Fax: [phone]    Apr 05, 2010  Re:     Peter Long Address:   [address]     [place], [place]  [address] DOB:     03-21-1956 MRN:     [id number]   Dear Dr. Valma Cava:  Reviewed recent 2D echo and Myoview reports as well as prior records from [facility].  Cardiac   catherization in 2005 by Dr. Elsie Lincoln revealed essentially normal coronaries and patient has   documented history of nonischemic cardiomyopathy, LVEF ranging from 10% to 50% based on   various studies.  Most consistent LVEF in 25-30% range which is similar to recent 2D echo.    Myoview without an large degree of ischemia.  He should therefore be able to proceed with planned   surgery on medical therapy at a moderate perioperative cardiovascular risk.  He is to establish   follow-up in our device clinic as well (St. Jude ICD placed by [facility]).     Sincerely,  Dr. Nona Dell

## 2010-12-21 NOTE — Letter (Signed)
Summary: Records from [facility]  Records from [facility]   Imported By: Raechel Ache [facility] 04/05/2010 14:15:52  _____________________________________________________________________  External Attachment:    Type:   Image     Comment:   External Document

## 2010-12-21 NOTE — Letter (Signed)
Summary: Medication list  Medication list   Imported By: Jacklynn Ganong 12/24/2009 16:32:35  _____________________________________________________________________  External Attachment:    Type:   Image     Comment:   External Document

## 2010-12-21 NOTE — Letter (Signed)
Summary: Clearance Letter  [facility] at [facility]  [address] [place] [address]   Phone: [phone]  Fax: [phone]    Apr 07, 2010  Re:     [name] Address:   [address]     [place], [place]  [address] DOB:     31-Aug-1956 MRN:     [id number]   Dear Dr. Hayden Rasmussen,    Patient may proceed with planned surgery.  Low risk for cardiac complications.    Sincerely,  Dr. Lewayne Bunting, MD, [facility]

## 2010-12-21 NOTE — Letter (Signed)
Summary: [place] [facility] at [facility]  [address] [place] [address]   Phone: [phone]  Fax: [phone]      Apr 06, 2010 MRN: [id number]   VIPUL CAFARELLI [address] [place], [place]  [address]   Dear Mr. Danko,  Your test ordered by Selena Batten has been reviewed by your physician (or physician assistant) and was found to be normal or stable. Your physician (or physician assistant) felt no changes were needed at this time.  __x__ Echocardiogram  _x___ Cardiac Stress Test  ____ Lab Work  ____ Peripheral vascular study of arms, legs or neck  ____ CT scan or X-ray  ____ Lung or Breathing test  ____ Other:  Your results are consistent with previous studies. You are cleared for surgery.  No change in medical treatment at this time, per Dr. Diona Browner.  Thank you, Offie Waide Allyne Gee RN    Rayland Bing, MD, [facility].Gaylord Shih, MD, F.A.C.C Lewayne Bunting, MD, F.A.C.C Nona Dell, MD, F.A.C.C Charlton Haws, MD, [facility].C.C

## 2010-12-21 NOTE — Miscellaneous (Signed)
Summary: OT discharge  OT discharge   Imported By: Jacklynn Ganong 04/16/2010 08:26:21  _____________________________________________________________________  External Attachment:    Type:   Image     Comment:   External Document

## 2010-12-21 NOTE — Procedures (Signed)
Summary: pc2 3 mon   Current Medications (verified): 1)  Bupropion Hcl 150 Mg Xr12h-Tab (Bupropion Hcl) .... Take 1 Tab Two Times A Day 2)  Digoxin 0.125 Mg Tabs (Digoxin) .... Take 1 Tab Daily 3)  Simvastatin 40 Mg Tabs (Simvastatin) .... Take 1 Tab Daily 4)  Gemfibrozil 600 Mg Tabs (Gemfibrozil) .... Take 1 Tab Two Times A Day 5)  Mirtazapine 15 Mg Tabs (Mirtazapine) .... Take 1 Tab At Bedtime 6)  Lisinopril 10 Mg Tabs (Lisinopril) .... Take 1 Tab Daily 7)  Citalopram Hydrobromide 40 Mg Tabs (Citalopram Hydrobromide) .... Take 1 Tab Daily 8)  Carvedilol 25 Mg Tabs (Carvedilol) .... Take 1 Tab Two Times A Day 9)  Metformin Hcl 1000 Mg Tabs (Metformin Hcl) .... Take 1tab Two Times A Day 10)  Aleve 220 Mg Tabs (Naproxen Sodium) .... Take As Needed 11)  Lantus 100 Unit/ml Soln (Insulin Glargine) .... 72 Units At Bedtime 12)  Theracodophen-650 10-650 Mg Misc (Hydrocodone-Apap-Dietary Prod) .... Every 4-6 Hours As Needed.  Allergies (verified): 1)  ! Morphine    ICD Specifications Following MD:  Lewayne Bunting, MD     ICD Vendor:  [facility] Jude     ICD Model Number:  [id number]     ICD Serial Number:  [id number] ICD DOI:  11/04/2004     ICD Implanting MD:  NOT IMPLANTED BY [place]  Lead 1:    Location: RV     DOI: 11/04/2004     Model #: [id number]     Serial #: [id number]     Status: active Lead 2:    Location: RV     DOI: 11/04/2004     Model #: 1570     Serial #: [id number]     Status: active  Indications::  CM   ICD Follow Up Remote Check?  No Battery Voltage:  2.59 V     Charge Time:  11.5 seconds     Underlying rhythm:  SR ICD Dependent:  No       ICD Device Measurements Right Ventricle:  Amplitude: 11.5 mV, Impedance: 495 ohms, Threshold: 0.75 V at 0.5 msec  Episodes Coumadin:  No Shock:  0     ATP:  0     Nonsustained:  0     Ventricular Pacing:  <1%  Brady Parameters Mode vvi     Lower Rate Limit:  40      Tachy Zones VF:  182     Next Cardiology Appt Due:  10/21/2010 Tech Comments:  No  parameter changes.  Device function normal.   Atlas device battery voltage 2.59 today.  ROV 3 months RDS clinic. Altha Harm, LPN  July 22, 2010 2:58 PM

## 2010-12-23 NOTE — Assessment & Plan Note (Signed)
Summary: 4 mth f/u per checkout on 07/16/10/tg   Visit Type:  Follow-up Referring Provider:  Dr. Valma Cava Primary Provider:  Dr. Karleen Hampshire   History of Present Illness: 55 year old male presents for followup. I saw him back in August 2011. Since then he had interval followup with Dr. Ladona Ridgel and had normal device function with no parameter changes made.  He is here with his wife today. Ambulates with a walker. They report that he has had some falls related to a possible "stroke" and also when he gets "impatient" and tries to do things without assistance. No frank syncope.  He denies any progressive shortness of breath, certainly no rest dyspnea or orthopnea. He has had some increasing lower extremity edema. We discussed sodium restriction. He is not on a standing diuretic at this point.  Reports that he is getting physical therapy at home 3 days a week.  Clinical Review Panels:  Echocardiogram Echocardiogram  Study Conclusions    - Left ventricle: The cavity size was mildly dilated. Wall thickness     was increased in a pattern of mild LVH. Systolic function was     moderately to severely reduced. The estimated ejection fraction     was in the range of 30% to 35%. Diffuse hypokinesis. Probable     akinesis of the lateral myocardium. Doppler parameters are     consistent with abnormal left ventricular relaxation (grade 1     diastolic dysfunction).   - Aortic valve: Mildly thickened leaflets.   - Mitral valve: Mild regurgitation.   - Left atrium: The atrium was at the upper limits of normal in size.   - Right ventricle: Pacer wire or catheter noted in right ventricle.     Systolic function was normal.   - Tricuspid valve: Trivial regurgitation.   - Pericardium, extracardiac: A trivial pericardial effusion was     identified. (03/31/2010)    Current Medications (verified): 1)  Bupropion Hcl 150 Mg Xr12h-Tab (Bupropion Hcl) .... Take 1 Tab Two Times A Day 2)  Digoxin  0.125 Mg Tabs (Digoxin) .... Take 1 Tab Daily 3)  Simvastatin 40 Mg Tabs (Simvastatin) .... Take 1 Tab Daily 4)  Mirtazapine 15 Mg Tabs (Mirtazapine) .... Take 1 Tab At Bedtime 5)  Lisinopril 10 Mg Tabs (Lisinopril) .... Take 1 Tab Daily 6)  Citalopram Hydrobromide 40 Mg Tabs (Citalopram Hydrobromide) .... Take 1 Tab Daily 7)  Carvedilol 25 Mg Tabs (Carvedilol) .... Take 1 Tab Two Times A Day 8)  Aleve 220 Mg Tabs (Naproxen Sodium) .... Take As Needed 9)  Lantus 100 Unit/ml Soln (Insulin Glargine) .... 80 Units At Bedtime 10)  Theracodophen-650 10-650 Mg Misc (Hydrocodone-Apap-Dietary Prod) .... Every 4-6 Hours As Needed. 11)  Oxycodone Hcl 30 Mg Tabs (Oxycodone Hcl) .... Take As Directed 12)  Furosemide 20 Mg Tabs (Furosemide) .... Take One Tablet By Mouth Daily.  Allergies (verified): 1)  ! Morphine  Comments:  Nurse/Medical Assistant: patients wife and i reviewed meds from previous ov and they use [facility]  [place]  Past History:  Past Medical History: Last updated: 07/16/2010 Chronic pain Diverticulitis C O P D Diabetes Type 2 Hyperlipidemia Hypertension Anxiety Depression Cerebrovascular Disease Nonischemic cardiomyopathy - LVEF 30-35% 5/11 MRSA sepsis CAD - possible, poorly documented  Past Surgical History: Last updated: 07/16/2010 Right gluteal abscess drainage 2005 Abdominal surgery 2004 ICD implantation 12/05 [facility] - Dr. Severiano Gilbert), St Jude Atlas Plus VR ERCP with stone extraction 5/10 Arthroscopic left shoulder surgery 6/11  Social History: Last  updated: 03/24/2010 Patient is married Tobacco Use - Yes Alcohol Use - no  Review of Systems       The patient complains of peripheral edema.  The patient denies anorexia, fever, weight loss, chest pain, syncope, hemoptysis, melena, and hematochezia.         Otherwise reviewed and negative.  Vital Signs:  Patient profile:   55 year old male Weight:      230 pounds BMI:     31.31 O2 Sat:      94 % on  Room air Pulse rate:   74 / minute BP sitting:   158 / 90  (right arm)  Vitals Entered By: Dreama Saa, CNA (December 06, 2010 3:18 PM)  O2 Flow:  Room air  Physical Exam  Additional Exam:  Obese, chronically ill-appearing disheveled male, in no acute distress. HEENT: Conjunctiva and lids normal, oropharynx with poor dentition. Neck: Supple, no elevated JVP or loud carotid bruits. Lungs: Diminished breath sounds, non-labored, no wheezing. Cardiac: Regular rate and rhythm, indistinct PMI, no S3 gallop or loud systolic murmur. Thorax: Well-healed device pocket site on the right. Abdomen: Obese, unable to palpate liver edge, bowel sounds present, no tenderness. Extremities: 1+ symmetrical edema, distal pulses one plus, some venous stasis. Neuropsychiatric: Alert and oriented x3, affect somewhat guarded. Muscle strength 3-4/5 left versus normal on the right.    ICD Specifications Following MD:  Lewayne Bunting, MD     ICD Vendor:  [facility]     ICD Model Number:  [id number]     ICD Serial Number:  [id number] ICD DOI:  11/04/2004     ICD Implanting MD:  NOT IMPLANTED BY [place]  Lead 1:    Location: RV     DOI: 11/04/2004     Model #: 0981     Serial #: [id number]     Status: active Lead 2:    Location: RV     DOI: 11/04/2004     Model #: 1570     Serial #: [id number]     Status: active  Indications::  CM   ICD Follow Up ICD Dependent:  No      Episodes Coumadin:  No  Brady Parameters Mode vvi     Lower Rate Limit:  40      Tachy Zones VF:  182     Impression & Recommendations:  Problem # 1:  UNSPECIFIED SECONDARY CARDIOMYOPATHY (ICD-425.9)  Nonischemic cardiomyopathy, relatively stable in terms of dyspnea, however with increasing lower extremity edema. Plan to add Lasix 20 mg daily and followup with a BMET and BMP in 2 weeks. May also need standing potassium supplement.  His updated medication list for this problem includes:    Digoxin 0.125 Mg Tabs (Digoxin) .Marland Kitchen... Take 1 tab daily     Lisinopril 10 Mg Tabs (Lisinopril) .Marland Kitchen... Take 1 tab daily    Carvedilol 25 Mg Tabs (Carvedilol) .Marland Kitchen... Take 1 tab two times a day    Furosemide 20 Mg Tabs (Furosemide) .Marland Kitchen... Take one tablet by mouth daily.  Future Orders: T-BNP  (B Natriuretic Peptide) [id number]) ... 12/20/2010  Problem # 2:  ESSENTIAL HYPERTENSION, BENIGN (ICD-401.1)  Blood pressure elevated. Again discussed sodium and fluid restriction. Reinforced medication compliance. Addition of diuretic may also be of benefit.  His updated medication list for this problem includes:    Lisinopril 10 Mg Tabs (Lisinopril) .Marland Kitchen... Take 1 tab daily    Carvedilol 25 Mg Tabs (Carvedilol) .Marland Kitchen... Take 1 tab two times a day  Furosemide 20 Mg Tabs (Furosemide) .Marland Kitchen... Take one tablet by mouth daily.  Future Orders: T-Comprehensive Metabolic Panel [id number]) ... 12/20/2010  Problem # 3:  AUTOMATIC IMPLANTABLE CARDIAC DEFIBRILLATOR SITU (ICD-V45.02)  Continue followup with Dr. Ladona Ridgel. No reported device discharges.  Problem # 4:  MIXED HYPERLIPIDEMIA (ICD-272.2)  Followup fasting lipid profile and liver function tests prior to next visit  The following medications were removed from the medication list:    Gemfibrozil 600 Mg Tabs (Gemfibrozil) .Marland Kitchen... Take 1 tab two times a day His updated medication list for this problem includes:    Simvastatin 40 Mg Tabs (Simvastatin) .Marland Kitchen... Take 1 tab daily  Future Orders: T-Lipid Profile ([id number]) ... 12/20/2010  Patient Instructions: 1)  Your physician recommends that you schedule a follow-up appointment in: 3 MONTHS 2)  Your physician recommends that you return for lab work in: 2 WEEKS 3)  Your physician has recommended you make the following change in your medication: FUROSEMIDE 20MG  DAILY Prescriptions: FUROSEMIDE 20 MG TABS (FUROSEMIDE) Take one tablet by mouth daily.  #30 x 3   Entered by:   Teressa Lower RN   Authorized by:   Loreli Slot, MD, [facility]   Signed by:   Teressa Lower RN on 12/06/2010   Method used:   Electronically to        [facility]. [phone]* (retail)       [address], [place]  [address]       Ph: [phone]       Fax: [phone]   RxID:   [id number]

## 2010-12-23 NOTE — Letter (Signed)
Summary: [place] [facility], [facility] at [employer]  [address], [place] [address]   Phone: [phone]  Fax: [phone]     December 06, 2010 MRN: [id number]   Peter Long [address] [place], [place]  [address]      YOUR LAB WORK IS DUE   December 20, 2010  Please go to [facility], located across the street from [facility] on the second floor.  Hours are Monday - Friday 7am until 7:30pm         Saturday 8am until 12noon    _X_  DO NOT EAT OR DRINK AFTER MIDNIGHT EVENING PRIOR TO LABWORK

## 2010-12-23 NOTE — Letter (Signed)
Summary: Device-Delinquent Check  [facility], Main Office  [address]   [place], [place] [address]   Phone: [phone]  Fax: [phone]     December 08, 2010 MRN: [id number]   Peter Long [address] [place], [place]  [address]   Dear [name],  According to our records, you have not had your implanted device checked in the recommended period of time.  We are unable to determine appropriate device function without checking your device on a regular basis.  Please call our office to schedule an appointment as soon as possible.  If you are having your device checked by another physician, please call us so that we may update our records.  Thank you,  Letta Moynahan, EMT  December 08, 2010 12:02 PM   [facility]  certified

## 2010-12-23 NOTE — Letter (Signed)
Summary: Appointment - Missed   [facility] [place]  [address] [place] [address]   Phone: [phone]  Fax: [phone]     November 05, 2010 MRN: [id number]   Peter Long [address] [place], [place]  [address]   Dear Mr. Dooly,  Our records indicate you missed your appointment on     11/05/10 PACEMAKER CHECK          It is very important that we reach you to reschedule this appointment. We look forward to participating in your health care needs. Please contact us at the number listed above at your earliest convenience to reschedule this appointment.     Sincerely,    Glass blower/designer

## 2011-01-02 NOTE — Discharge Summary (Signed)
NAME:  Peter Long, Peter Long               ACCOUNT NO.:  [id number]  MEDICAL RECORD NO.:  [id number]          PATIENT TYPE:  INP  LOCATION:  A316                          FACILITY:  [facility]  PHYSICIAN:  Valmai Vandenberghe L. Lendell Long, MDDATE OF BIRTH:  Oct 26, 1956  DATE OF ADMISSION:  11/09/2010 DATE OF DISCHARGE:  12/21/2011LH                              DISCHARGE SUMMARY   DISCHARGE DIAGNOSES: 1. Acute on chronic gait disorder. 2. Hypokalemia. 3. History of mood disorder. 4. Chronic pain. 5. History of stroke in 2010. 6. Type 2 diabetes. 7. History of AICD. 8. Tobacco abuse. 9. History of congestive heart failure, dilated cardiomyopathy,     compensated.  DISCHARGE MEDICATIONS: 1. Aspirin 325 mg a day. 2. Bupropion 150 mg twice a day. 3. Citalopram 20 mg a day. 4. Coreg 25 mg twice a day. 5. Digoxin 0.125 mg p.o. daily. 6. Diazepam 1 mg t.i.d. as needed for muscle spasms. 7. Hydrocodone/APAP 10/650 one tablet q.6 hours p.r.n. pain. 8. Lantus insulin 82 units subcutaneously nightly. 9. Lisinopril 10 mg a day. 10.Oxycodone 15 mg p.o. q.6 hours p.r.n. pain. 11.OxyContin 20 mg twice a day. 12.Remeron 30 mg nightly. 13.Zocor 40 mg a day.  CONDITION:  Stable.  ACTIVITY:  Ad lib.  DIET:  diabetic heart-healthy.  FOLLOWUP:  Follow up with Dr. Milford Cage.  He is to call for an appointment.  CONSULTATIONS:  None.  PROCEDURES:  None.  LABS:  CBC significant for white blood cell count of 10,700, otherwise unremarkable.  Basic metabolic panel on admission significant for potassium of 3.4, glucose of 161.  Hemoglobin A1c was 7.9.  Total CPK was 368 with a normal MB fraction.  Serial troponins were normal.  BNP was 167.  LDL 30, HDL 27, triglycerides 225, total cholesterol 102, TSH 0.526.  Homocysteine 12.  Urinalysis negative except for trace blood.  DIAGNOSTICS:  Chest x-ray showed nothing acute.  CT brain showed chronic ischemic changes on the right, nothing acute, left mastoiditis.   EKG showed normal sinus rhythm, incomplete left bundle-branch block, prolonged QT.  No significant change from previous.  HISTORY AND HOSPITAL COURSE:  Peter Long is a 55 year old white male with a history of previous stroke who came to the emergency room with acute on chronic gait disturbance.  He has a residual left-sided weakness from previous stroke, but his gait worsened over 4 days.  He fell several times and hit his head on the day prior to admission.  In the emergency room, he had a blood pressure of 151/78, otherwise normal vital signs. He had slight left leg weakness and had difficulty standing or walking. He was admitted by Dr. Mikeal Hawthorne.  After admission, he refused most of his tests including echocardiogram and other tests.  He was noted to be walking around the unit, yelling at staff.  He also yelled at me when I came in to evaluate him.  The patient certainly had improved gait from what I witnessed.  As he was verbally abusive and refusing most tests, he was discharged.  I called Dr. Samul Dada previous office and was told that he no longer works there.  I have recommended  that he follow up with Dr. Milford Cage in the office and the patient can have a stroke workup at that time if he complies.  He had also had a Neuro consult and several other tests ordered, but these were cancelled due to noncompliance.  I added aspirin to his regimen which he was not on previously.  Total time on the day of discharge greater than 30 minutes.     Peter Diana L. Lendell Caprice, MD     CLS/MEDQ  D:  12/09/2010  T:  12/09/2010  Job:  [id number]  Electronically Signed by Crista Curb MD on 01/02/2011 09:58:54 PM

## 2011-01-13 ENCOUNTER — Encounter: Payer: Self-pay | Admitting: Internal Medicine

## 2011-01-27 NOTE — Cardiovascular Report (Signed)
Summary: Certified Letter Returned - Not doing f/u  Certified Letter Returned - Not doing f/u   Imported By: Debby Freiberg 01/19/2011 16:40:28  _____________________________________________________________________  External Attachment:    Type:   Image     Comment:   External Document

## 2011-01-31 LAB — CBC
HCT: 42.6 % (ref 39.0–52.0)
MCV: 83.4 fL (ref 78.0–100.0)
MCV: 83.7 fL (ref 78.0–100.0)
MCV: 84.6 fL (ref 78.0–100.0)
Platelets: 203 10*3/uL (ref 150–400)
Platelets: 228 10*3/uL (ref 150–400)
Platelets: 241 10*3/uL (ref 150–400)
RBC: 4.9 MIL/uL (ref 4.22–5.81)
RBC: 5.11 MIL/uL (ref 4.22–5.81)
RDW: 12.2 % (ref 11.5–15.5)
WBC: 10.7 10*3/uL — ABNORMAL HIGH (ref 4.0–10.5)
WBC: 12.1 10*3/uL — ABNORMAL HIGH (ref 4.0–10.5)
WBC: 9.4 10*3/uL (ref 4.0–10.5)

## 2011-01-31 LAB — BASIC METABOLIC PANEL
BUN: 8 mg/dL (ref 6–23)
Chloride: 102 mEq/L (ref 96–112)
Chloride: 104 mEq/L (ref 96–112)
Creatinine, Ser: 0.92 mg/dL (ref 0.4–1.5)
GFR calc Af Amer: >60 mL/min (ref >60)
Glucose, Bld: 183 mg/dL — ABNORMAL HIGH (ref 70–99)
Potassium: 3.4 mEq/L — ABNORMAL LOW (ref 3.5–5.1)
Potassium: 3.5 mEq/L (ref 3.5–5.1)

## 2011-01-31 LAB — COMPREHENSIVE METABOLIC PANEL
Albumin: 3.2 g/dL — ABNORMAL LOW (ref 3.5–5.2)
Alkaline Phosphatase: 64 U/L (ref 39–117)
BUN: 5 mg/dL — ABNORMAL LOW (ref 6–23)
Creatinine, Ser: 0.84 mg/dL (ref 0.4–1.5)
GFR calc Af Amer: >60 mL/min (ref >60)
Potassium: 3.2 mEq/L — ABNORMAL LOW (ref 3.5–5.1)
Total Protein: 6.3 g/dL (ref 6.0–8.3)

## 2011-01-31 LAB — DIFFERENTIAL
Eosinophils Absolute: 0.3 10*3/uL (ref 0.0–0.7)
Eosinophils Relative: 3 % (ref 0–5)
Eosinophils Relative: 3 % (ref 0–5)
Lymphocytes Relative: 16 % (ref 12–46)
Lymphocytes Relative: 22 % (ref 12–46)
Lymphocytes Relative: 29 % (ref 12–46)
Lymphs Abs: 1.7 10*3/uL (ref 0.7–4.0)
Lymphs Abs: 2.7 10*3/uL (ref 0.7–4.0)
Monocytes Absolute: 0.7 10*3/uL (ref 0.1–1.0)
Monocytes Absolute: 1 10*3/uL (ref 0.1–1.0)
Monocytes Absolute: 1 10*3/uL (ref 0.1–1.0)
Monocytes Relative: 10 % (ref 3–12)
Monocytes Relative: 10 % (ref 3–12)
Neutro Abs: 5.3 10*3/uL (ref 1.7–7.7)
Neutro Abs: 8.3 10*3/uL — ABNORMAL HIGH (ref 1.7–7.7)

## 2011-01-31 LAB — APTT
aPTT: 35 seconds (ref 24–37)
aPTT: 37 seconds (ref 24–37)

## 2011-01-31 LAB — URINALYSIS, ROUTINE W REFLEX MICROSCOPIC
Glucose, UA: NEGATIVE mg/dL
Ketones, ur: NEGATIVE mg/dL
Leukocytes, UA: NEGATIVE
Nitrite: NEGATIVE
Protein, ur: NEGATIVE mg/dL
Urobilinogen, UA: 1 mg/dL (ref 0.0–1.0)

## 2011-01-31 LAB — CARDIAC PANEL(CRET KIN+CKTOT+MB+TROPI)
CK, MB: 3.1 ng/mL (ref 0.3–4.0)
Total CK: 256 U/L — ABNORMAL HIGH (ref 7–232)
Total CK: 261 U/L — ABNORMAL HIGH (ref 7–232)
Troponin I: 0.02 ng/mL (ref 0.00–0.06)
Troponin I: 0.03 ng/mL (ref 0.00–0.06)

## 2011-01-31 LAB — PROTIME-INR
INR: 1.15 (ref 0.00–1.49)
INR: 1.16 (ref 0.00–1.49)
Prothrombin Time: 14.9 seconds (ref 11.6–15.2)
Prothrombin Time: 15 seconds (ref 11.6–15.2)

## 2011-01-31 LAB — DIGOXIN LEVEL: Digoxin Level: <0.2 ng/mL — ABNORMAL LOW (ref 0.8–2.0)

## 2011-01-31 LAB — HOMOCYSTEINE: Homocysteine: 12.9 umol/L (ref 4.0–15.4)

## 2011-01-31 LAB — BRAIN NATRIURETIC PEPTIDE: Pro B Natriuretic peptide (BNP): 167 pg/mL — ABNORMAL HIGH (ref 0.0–100.0)

## 2011-01-31 LAB — GLUCOSE, CAPILLARY
Glucose-Capillary: 102 mg/dL — ABNORMAL HIGH (ref 70–99)
Glucose-Capillary: 171 mg/dL — ABNORMAL HIGH (ref 70–99)

## 2011-01-31 LAB — MAGNESIUM: Magnesium: 2 mg/dL (ref 1.5–2.5)

## 2011-01-31 LAB — MRSA PCR SCREENING: MRSA by PCR: NEGATIVE

## 2011-01-31 LAB — TROPONIN I: Troponin I: 0.05 ng/mL (ref 0.00–0.06)

## 2011-01-31 LAB — LIPID PANEL: VLDL: 45 mg/dL — ABNORMAL HIGH (ref 0–40)

## 2011-02-06 LAB — URINALYSIS, ROUTINE W REFLEX MICROSCOPIC
Bilirubin Urine: NEGATIVE
Hgb urine dipstick: NEGATIVE
Ketones, ur: NEGATIVE mg/dL
Nitrite: NEGATIVE
Protein, ur: NEGATIVE mg/dL
Specific Gravity, Urine: 1.019 (ref 1.005–1.030)
Urobilinogen, UA: 0.2 mg/dL (ref 0.0–1.0)

## 2011-02-06 LAB — DIFFERENTIAL
Basophils Absolute: 0 10*3/uL (ref 0.0–0.1)
Basophils Relative: 0 % (ref 0–1)
Eosinophils Absolute: 0.3 10*3/uL (ref 0.0–0.7)
Monocytes Relative: 6 % (ref 3–12)
Neutro Abs: 6.9 10*3/uL (ref 1.7–7.7)
Neutrophils Relative %: 70 % (ref 43–77)

## 2011-02-06 LAB — COMPREHENSIVE METABOLIC PANEL
ALT: 18 U/L (ref 0–53)
Alkaline Phosphatase: 71 U/L (ref 39–117)
BUN: 10 mg/dL (ref 6–23)
CO2: 29 mEq/L (ref 19–32)
GFR calc non Af Amer: >60 mL/min (ref >60)
Glucose, Bld: 158 mg/dL — ABNORMAL HIGH (ref 70–99)
Potassium: 3.9 mEq/L (ref 3.5–5.1)
Sodium: 140 mEq/L (ref 135–145)
Total Bilirubin: 0.4 mg/dL (ref 0.3–1.2)
Total Protein: 6.9 g/dL (ref 6.0–8.3)

## 2011-02-06 LAB — SURGICAL PCR SCREEN
MRSA, PCR: NEGATIVE
Staphylococcus aureus: NEGATIVE

## 2011-02-06 LAB — CBC
HCT: 46.9 % (ref 39.0–52.0)
Hemoglobin: 15.7 g/dL (ref 13.0–17.0)
RBC: 5.25 MIL/uL (ref 4.22–5.81)
RDW: 13.2 % (ref 11.5–15.5)

## 2011-02-06 LAB — CROSSMATCH: Antibody Screen: NEGATIVE

## 2011-02-06 LAB — PROTIME-INR
INR: 1.08 (ref 0.00–1.49)
Prothrombin Time: 13.9 seconds (ref 11.6–15.2)

## 2011-02-06 LAB — GLUCOSE, CAPILLARY
Glucose-Capillary: 109 mg/dL — ABNORMAL HIGH (ref 70–99)
Glucose-Capillary: 111 mg/dL — ABNORMAL HIGH (ref 70–99)
Glucose-Capillary: 141 mg/dL — ABNORMAL HIGH (ref 70–99)
Glucose-Capillary: 160 mg/dL — ABNORMAL HIGH (ref 70–99)
Glucose-Capillary: 68 mg/dL — ABNORMAL LOW (ref 70–99)

## 2011-02-06 LAB — APTT: aPTT: 29 seconds (ref 24–37)

## 2011-02-11 LAB — COMPREHENSIVE METABOLIC PANEL
ALT: 20 U/L (ref 0–53)
Alkaline Phosphatase: 76 U/L (ref 39–117)
CO2: 26 mEq/L (ref 19–32)
Chloride: 104 mEq/L (ref 96–112)
GFR calc non Af Amer: >60 mL/min (ref >60)
Glucose, Bld: 162 mg/dL — ABNORMAL HIGH (ref 70–99)
Potassium: 3.2 mEq/L — ABNORMAL LOW (ref 3.5–5.1)
Sodium: 138 mEq/L (ref 135–145)
Total Bilirubin: 0.5 mg/dL (ref 0.3–1.2)

## 2011-02-11 LAB — DIFFERENTIAL
Basophils Absolute: 0.1 10*3/uL (ref 0.0–0.1)
Basophils Relative: 1 % (ref 0–1)
Eosinophils Absolute: 0.2 10*3/uL (ref 0.0–0.7)
Monocytes Relative: 7 % (ref 3–12)
Neutrophils Relative %: 69 % (ref 43–77)

## 2011-02-11 LAB — URINALYSIS, ROUTINE W REFLEX MICROSCOPIC
Glucose, UA: NEGATIVE mg/dL
Ketones, ur: NEGATIVE mg/dL
Protein, ur: NEGATIVE mg/dL
Urobilinogen, UA: 0.2 mg/dL (ref 0.0–1.0)

## 2011-02-11 LAB — URINE CULTURE

## 2011-02-11 LAB — CBC
HCT: 43.8 % (ref 39.0–52.0)
Hemoglobin: 15.5 g/dL (ref 13.0–17.0)
RBC: 4.99 MIL/uL (ref 4.22–5.81)

## 2011-02-11 LAB — LIPASE, BLOOD: Lipase: 45 U/L (ref 11–59)

## 2011-02-11 LAB — URINE MICROSCOPIC-ADD ON

## 2011-02-23 LAB — CBC
HCT: 45.3 % (ref 39.0–52.0)
HCT: 45.1 % (ref 39.0–52.0)
HCT: 45.6 % (ref 39.0–52.0)
Hemoglobin: 16 g/dL (ref 13.0–17.0)
Hemoglobin: 16.2 g/dL (ref 13.0–17.0)
MCHC: 35.6 g/dL (ref 30.0–36.0)
MCV: 89.7 fL (ref 78.0–100.0)
MCV: 90.3 fL (ref 78.0–100.0)
MCV: 91.1 fL (ref 78.0–100.0)
Platelets: 210 10*3/uL (ref 150–400)
Platelets: 219 10*3/uL (ref 150–400)
Platelets: 238 10*3/uL (ref 150–400)
RBC: 5.18 MIL/uL (ref 4.22–5.81)
RBC: 5.25 MIL/uL (ref 4.22–5.81)
RBC: 5.46 MIL/uL (ref 4.22–5.81)
RDW: 12 % (ref 11.5–15.5)
RDW: 12 % (ref 11.5–15.5)
WBC: 14.9 10*3/uL — ABNORMAL HIGH (ref 4.0–10.5)
WBC: 11.7 10*3/uL — ABNORMAL HIGH (ref 4.0–10.5)
WBC: 13.5 10*3/uL — ABNORMAL HIGH (ref 4.0–10.5)
WBC: 18.6 10*3/uL — ABNORMAL HIGH (ref 4.0–10.5)

## 2011-02-23 LAB — GLUCOSE, CAPILLARY
Glucose-Capillary: 169 mg/dL — ABNORMAL HIGH (ref 70–99)
Glucose-Capillary: 293 mg/dL — ABNORMAL HIGH (ref 70–99)
Glucose-Capillary: 117 mg/dL — ABNORMAL HIGH (ref 70–99)
Glucose-Capillary: 120 mg/dL — ABNORMAL HIGH (ref 70–99)
Glucose-Capillary: 121 mg/dL — ABNORMAL HIGH (ref 70–99)
Glucose-Capillary: 124 mg/dL — ABNORMAL HIGH (ref 70–99)
Glucose-Capillary: 133 mg/dL — ABNORMAL HIGH (ref 70–99)
Glucose-Capillary: 141 mg/dL — ABNORMAL HIGH (ref 70–99)
Glucose-Capillary: 143 mg/dL — ABNORMAL HIGH (ref 70–99)
Glucose-Capillary: 147 mg/dL — ABNORMAL HIGH (ref 70–99)
Glucose-Capillary: 148 mg/dL — ABNORMAL HIGH (ref 70–99)
Glucose-Capillary: 162 mg/dL — ABNORMAL HIGH (ref 70–99)
Glucose-Capillary: 167 mg/dL — ABNORMAL HIGH (ref 70–99)
Glucose-Capillary: 175 mg/dL — ABNORMAL HIGH (ref 70–99)
Glucose-Capillary: 181 mg/dL — ABNORMAL HIGH (ref 70–99)
Glucose-Capillary: 186 mg/dL — ABNORMAL HIGH (ref 70–99)
Glucose-Capillary: 208 mg/dL — ABNORMAL HIGH (ref 70–99)

## 2011-02-23 LAB — URINALYSIS, ROUTINE W REFLEX MICROSCOPIC
Glucose, UA: >1000 mg/dL — AB
Leukocytes, UA: NEGATIVE
Nitrite: NEGATIVE
Protein, ur: NEGATIVE mg/dL
Protein, ur: NEGATIVE mg/dL
Specific Gravity, Urine: 1.031 — ABNORMAL HIGH (ref 1.005–1.030)
Urobilinogen, UA: 0.2 mg/dL (ref 0.0–1.0)
Urobilinogen, UA: 1 mg/dL (ref 0.0–1.0)

## 2011-02-23 LAB — CULTURE, BLOOD (ROUTINE X 2): Culture: NO GROWTH

## 2011-02-23 LAB — DIFFERENTIAL
Basophils Absolute: 0 10*3/uL (ref 0.0–0.1)
Basophils Absolute: 0 10*3/uL (ref 0.0–0.1)
Basophils Relative: 0 % (ref 0–1)
Eosinophils Absolute: 0.3 10*3/uL (ref 0.0–0.7)
Eosinophils Absolute: 0.4 10*3/uL (ref 0.0–0.7)
Eosinophils Relative: 1 % (ref 0–5)
Eosinophils Relative: 3 % (ref 0–5)
Lymphocytes Relative: 20 % (ref 12–46)
Lymphs Abs: 2.7 10*3/uL (ref 0.7–4.0)
Monocytes Absolute: 1 10*3/uL (ref 0.1–1.0)
Monocytes Absolute: 1.3 10*3/uL — ABNORMAL HIGH (ref 0.1–1.0)
Monocytes Absolute: 1.6 10*3/uL — ABNORMAL HIGH (ref 0.1–1.0)
Neutro Abs: 12.4 10*3/uL — ABNORMAL HIGH (ref 1.7–7.7)
Neutrophils Relative %: 83 % — ABNORMAL HIGH (ref 43–77)

## 2011-02-23 LAB — COMPREHENSIVE METABOLIC PANEL
ALT: 26 U/L (ref 0–53)
ALT: 28 U/L (ref 0–53)
AST: 27 U/L (ref 0–37)
AST: 35 U/L (ref 0–37)
Albumin: 3.9 g/dL (ref 3.5–5.2)
Albumin: 4.2 g/dL (ref 3.5–5.2)
Alkaline Phosphatase: 80 U/L (ref 39–117)
BUN: 14 mg/dL (ref 6–23)
CO2: 23 mEq/L (ref 19–32)
CO2: 28 mEq/L (ref 19–32)
Calcium: 9.8 mg/dL (ref 8.4–10.5)
Chloride: 98 mEq/L (ref 96–112)
Chloride: 102 mEq/L (ref 96–112)
Chloride: 103 mEq/L (ref 96–112)
GFR calc Af Amer: >60 mL/min (ref >60)
GFR calc Af Amer: >60 mL/min (ref >60)
GFR calc non Af Amer: >60 mL/min (ref >60)
GFR calc non Af Amer: >60 mL/min (ref >60)
Glucose, Bld: 345 mg/dL — ABNORMAL HIGH (ref 70–99)
Potassium: 4 mEq/L (ref 3.5–5.1)
Potassium: 4 mEq/L (ref 3.5–5.1)
Sodium: 137 mEq/L (ref 135–145)
Sodium: 139 mEq/L (ref 135–145)
Total Bilirubin: 0.6 mg/dL (ref 0.3–1.2)
Total Bilirubin: 0.6 mg/dL (ref 0.3–1.2)
Total Bilirubin: 0.9 mg/dL (ref 0.3–1.2)

## 2011-02-23 LAB — TROPONIN I: Troponin I: 0.02 ng/mL (ref 0.00–0.06)

## 2011-02-23 LAB — BASIC METABOLIC PANEL
BUN: 12 mg/dL (ref 6–23)
BUN: 13 mg/dL (ref 6–23)
BUN: 14 mg/dL (ref 6–23)
CO2: 24 mEq/L (ref 19–32)
Calcium: 9.3 mg/dL (ref 8.4–10.5)
Calcium: 9.7 mg/dL (ref 8.4–10.5)
Chloride: 103 mEq/L (ref 96–112)
Creatinine, Ser: 0.92 mg/dL (ref 0.4–1.5)
GFR calc Af Amer: >60 mL/min (ref >60)
GFR calc non Af Amer: >60 mL/min (ref >60)
GFR calc non Af Amer: >60 mL/min (ref >60)
GFR calc non Af Amer: >60 mL/min (ref >60)
Glucose, Bld: 111 mg/dL — ABNORMAL HIGH (ref 70–99)
Glucose, Bld: 125 mg/dL — ABNORMAL HIGH (ref 70–99)
Glucose, Bld: 160 mg/dL — ABNORMAL HIGH (ref 70–99)
Potassium: 3.5 mEq/L (ref 3.5–5.1)
Potassium: 3.5 mEq/L (ref 3.5–5.1)
Sodium: 136 mEq/L (ref 135–145)
Sodium: 139 mEq/L (ref 135–145)

## 2011-02-23 LAB — CLOSTRIDIUM DIFFICILE EIA
C difficile Toxins A+B, EIA: NEGATIVE
C difficile Toxins A+B, EIA: NEGATIVE

## 2011-02-23 LAB — T3: T3, Total: 97.8 ng/dl (ref 80.0–204.0)

## 2011-02-23 LAB — CARDIAC PANEL(CRET KIN+CKTOT+MB+TROPI)
CK, MB: 10.9 ng/mL — ABNORMAL HIGH (ref 0.3–4.0)
CK, MB: 6.3 ng/mL — ABNORMAL HIGH (ref 0.3–4.0)
Relative Index: 1.2 (ref 0.0–2.5)
Total CK: 893 U/L — ABNORMAL HIGH (ref 7–232)
Troponin I: 0.02 ng/mL (ref 0.00–0.06)

## 2011-02-23 LAB — URINE CULTURE
Colony Count: NO GROWTH
Culture: NO GROWTH

## 2011-02-23 LAB — LIPID PANEL
Cholesterol: 103 mg/dL (ref 0–200)
HDL: 27 mg/dL — ABNORMAL LOW (ref >39)
LDL Cholesterol: NEGATIVE mg/dL (ref 0–99)
Total CHOL/HDL Ratio: 3.8 RATIO

## 2011-02-23 LAB — ETHANOL: Alcohol, Ethyl (B): <5 mg/dL (ref 0–10)

## 2011-02-23 LAB — URINE MICROSCOPIC-ADD ON

## 2011-02-23 LAB — CK TOTAL AND CKMB (NOT AT ARMC): CK, MB: 3.6 ng/mL (ref 0.3–4.0)

## 2011-02-23 LAB — HEMOGLOBIN A1C: Hgb A1c MFr Bld: 9.6 % — ABNORMAL HIGH (ref 4.6–6.1)

## 2011-02-23 LAB — PROTIME-INR: Prothrombin Time: 13.2 seconds (ref 11.6–15.2)

## 2011-02-23 LAB — CREATININE, SERUM: GFR calc Af Amer: >60 mL/min (ref >60)

## 2011-02-23 LAB — T4: T4, Total: 7.5 ug/dL (ref 5.0–12.5)

## 2011-03-01 LAB — CBC
HCT: 42.9 % (ref 39.0–52.0)
HCT: 50.8 % (ref 39.0–52.0)
Hemoglobin: 14.6 g/dL (ref 13.0–17.0)
Hemoglobin: 17.9 g/dL — ABNORMAL HIGH (ref 13.0–17.0)
MCHC: 35.5 g/dL (ref 30.0–36.0)
MCHC: 35.5 g/dL (ref 30.0–36.0)
MCV: 90 fL (ref 78.0–100.0)
MCV: 90.6 fL (ref 78.0–100.0)
MCV: 90.7 fL (ref 78.0–100.0)
Platelets: 170 10*3/uL (ref 150–400)
Platelets: 218 10*3/uL (ref 150–400)
RBC: 4.73 MIL/uL (ref 4.22–5.81)
RBC: 5.56 MIL/uL (ref 4.22–5.81)
RDW: 13.4 % (ref 11.5–15.5)
WBC: 7.4 10*3/uL (ref 4.0–10.5)

## 2011-03-01 LAB — URINALYSIS, ROUTINE W REFLEX MICROSCOPIC
Bilirubin Urine: NEGATIVE
Glucose, UA: >1000 mg/dL — AB
Hgb urine dipstick: NEGATIVE
Hgb urine dipstick: NEGATIVE
Ketones, ur: 15 mg/dL — AB
Ketones, ur: NEGATIVE mg/dL
Nitrite: NEGATIVE
Specific Gravity, Urine: 1.02 (ref 1.005–1.030)
Urobilinogen, UA: 2 mg/dL — ABNORMAL HIGH (ref 0.0–1.0)
pH: 6 (ref 5.0–8.0)

## 2011-03-01 LAB — CARDIAC PANEL(CRET KIN+CKTOT+MB+TROPI)
CK, MB: 2.3 ng/mL (ref 0.3–4.0)
CK, MB: 3.3 ng/mL (ref 0.3–4.0)
Relative Index: 1.5 (ref 0.0–2.5)
Relative Index: 1.8 (ref 0.0–2.5)
Total CK: 110 U/L (ref 7–232)
Troponin I: 0.02 ng/mL (ref 0.00–0.06)
Troponin I: 0.04 ng/mL (ref 0.00–0.06)

## 2011-03-01 LAB — DIFFERENTIAL
Basophils Absolute: 0 10*3/uL (ref 0.0–0.1)
Basophils Relative: 0 % (ref 0–1)
Basophils Relative: 0 % (ref 0–1)
Basophils Relative: 0 % (ref 0–1)
Basophils Relative: 0 % (ref 0–1)
Eosinophils Absolute: 0 10*3/uL (ref 0.0–0.7)
Eosinophils Absolute: 0.1 10*3/uL (ref 0.0–0.7)
Eosinophils Absolute: 0.2 10*3/uL (ref 0.0–0.7)
Eosinophils Absolute: 0.3 10*3/uL (ref 0.0–0.7)
Eosinophils Absolute: 0.4 10*3/uL (ref 0.0–0.7)
Eosinophils Relative: 0 % (ref 0–5)
Eosinophils Relative: 1 % (ref 0–5)
Eosinophils Relative: 4 % (ref 0–5)
Eosinophils Relative: 5 % (ref 0–5)
Lymphocytes Relative: 27 % (ref 12–46)
Lymphs Abs: 1.2 10*3/uL (ref 0.7–4.0)
Lymphs Abs: 1.9 10*3/uL (ref 0.7–4.0)
Lymphs Abs: 2.2 10*3/uL (ref 0.7–4.0)
Lymphs Abs: 2.3 10*3/uL (ref 0.7–4.0)
Monocytes Absolute: 0.7 10*3/uL (ref 0.1–1.0)
Monocytes Absolute: 0.8 10*3/uL (ref 0.1–1.0)
Monocytes Relative: 10 % (ref 3–12)
Monocytes Relative: 10 % (ref 3–12)
Monocytes Relative: 7 % (ref 3–12)
Neutro Abs: 5 10*3/uL (ref 1.7–7.7)
Neutrophils Relative %: 54 % (ref 43–77)
Neutrophils Relative %: 60 % (ref 43–77)
Neutrophils Relative %: 61 % (ref 43–77)
Neutrophils Relative %: 83 % — ABNORMAL HIGH (ref 43–77)
Neutrophils Relative %: 87 % — ABNORMAL HIGH (ref 43–77)

## 2011-03-01 LAB — HOMOCYSTEINE: Homocysteine: 8.6 umol/L (ref 4.0–15.4)

## 2011-03-01 LAB — GLUCOSE, CAPILLARY
Glucose-Capillary: 113 mg/dL — ABNORMAL HIGH (ref 70–99)
Glucose-Capillary: 149 mg/dL — ABNORMAL HIGH (ref 70–99)
Glucose-Capillary: 155 mg/dL — ABNORMAL HIGH (ref 70–99)
Glucose-Capillary: 177 mg/dL — ABNORMAL HIGH (ref 70–99)
Glucose-Capillary: 179 mg/dL — ABNORMAL HIGH (ref 70–99)
Glucose-Capillary: 197 mg/dL — ABNORMAL HIGH (ref 70–99)
Glucose-Capillary: 212 mg/dL — ABNORMAL HIGH (ref 70–99)
Glucose-Capillary: 275 mg/dL — ABNORMAL HIGH (ref 70–99)

## 2011-03-01 LAB — COMPREHENSIVE METABOLIC PANEL
ALT: 214 U/L — ABNORMAL HIGH (ref 0–53)
ALT: 216 U/L — ABNORMAL HIGH (ref 0–53)
AST: 177 U/L — ABNORMAL HIGH (ref 0–37)
AST: 43 U/L — ABNORMAL HIGH (ref 0–37)
AST: 54 U/L — ABNORMAL HIGH (ref 0–37)
Albumin: 3.4 g/dL — ABNORMAL LOW (ref 3.5–5.2)
Albumin: 3.7 g/dL (ref 3.5–5.2)
Alkaline Phosphatase: 110 U/L (ref 39–117)
Alkaline Phosphatase: 116 U/L (ref 39–117)
Alkaline Phosphatase: 92 U/L (ref 39–117)
CO2: 28 mEq/L (ref 19–32)
Chloride: 104 mEq/L (ref 96–112)
GFR calc Af Amer: >60 mL/min (ref >60)
GFR calc Af Amer: >60 mL/min (ref >60)
GFR calc non Af Amer: >60 mL/min (ref >60)
Glucose, Bld: 164 mg/dL — ABNORMAL HIGH (ref 70–99)
Glucose, Bld: 255 mg/dL — ABNORMAL HIGH (ref 70–99)
Potassium: 3.6 mEq/L (ref 3.5–5.1)
Potassium: 3.8 mEq/L (ref 3.5–5.1)
Potassium: 3.8 mEq/L (ref 3.5–5.1)
Sodium: 138 mEq/L (ref 135–145)
Sodium: 139 mEq/L (ref 135–145)
Sodium: 139 mEq/L (ref 135–145)
Total Bilirubin: 0.7 mg/dL (ref 0.3–1.2)
Total Protein: 5.7 g/dL — ABNORMAL LOW (ref 6.0–8.3)
Total Protein: 6.1 g/dL (ref 6.0–8.3)
Total Protein: 7.5 g/dL (ref 6.0–8.3)

## 2011-03-01 LAB — BASIC METABOLIC PANEL
BUN: 9 mg/dL (ref 6–23)
CO2: 22 mEq/L (ref 19–32)
CO2: 30 mEq/L (ref 19–32)
Calcium: 8.9 mg/dL (ref 8.4–10.5)
Chloride: 100 mEq/L (ref 96–112)
Chloride: 104 mEq/L (ref 96–112)
Chloride: 107 mEq/L (ref 96–112)
Creatinine, Ser: 0.96 mg/dL (ref 0.4–1.5)
Creatinine, Ser: 0.99 mg/dL (ref 0.4–1.5)
GFR calc Af Amer: >60 mL/min (ref >60)
GFR calc Af Amer: >60 mL/min (ref >60)
Glucose, Bld: 236 mg/dL — ABNORMAL HIGH (ref 70–99)
Potassium: 3.4 mEq/L — ABNORMAL LOW (ref 3.5–5.1)
Potassium: 3.9 mEq/L (ref 3.5–5.1)
Sodium: 137 mEq/L (ref 135–145)

## 2011-03-01 LAB — HEPATIC FUNCTION PANEL
ALT: 474 U/L — ABNORMAL HIGH (ref 0–53)
AST: 163 U/L — ABNORMAL HIGH (ref 0–37)
Albumin: 3.5 g/dL (ref 3.5–5.2)
Alkaline Phosphatase: 191 U/L — ABNORMAL HIGH (ref 39–117)
Bilirubin, Direct: 0.7 mg/dL — ABNORMAL HIGH (ref 0.0–0.3)
Bilirubin, Direct: 3.5 mg/dL — ABNORMAL HIGH (ref 0.0–0.3)
Indirect Bilirubin: 0.8 mg/dL (ref 0.3–0.9)
Indirect Bilirubin: 2.2 mg/dL — ABNORMAL HIGH (ref 0.3–0.9)
Total Bilirubin: 1.8 mg/dL — ABNORMAL HIGH (ref 0.3–1.2)
Total Protein: 5.5 g/dL — ABNORMAL LOW (ref 6.0–8.3)
Total Protein: 6.4 g/dL (ref 6.0–8.3)
Total Protein: 7.6 g/dL (ref 6.0–8.3)

## 2011-03-01 LAB — POCT CARDIAC MARKERS: Myoglobin, poc: 64.6 ng/mL (ref 12–200)

## 2011-03-01 LAB — LIPID PANEL
Cholesterol: 170 mg/dL (ref 0–200)
LDL Cholesterol: 93 mg/dL (ref 0–99)
Total CHOL/HDL Ratio: 6.8 RATIO

## 2011-03-01 LAB — PROTIME-INR: INR: 1.2 (ref 0.00–1.49)

## 2011-03-01 LAB — LIPASE, BLOOD: Lipase: 23 U/L (ref 11–59)

## 2011-03-01 LAB — URINE MICROSCOPIC-ADD ON

## 2011-04-05 NOTE — Op Note (Signed)
NAME:  Peter Long, Peter Long               ACCOUNT NO.:  [id number]   MEDICAL RECORD NO.:  [id number]          PATIENT TYPE:  INP   LOCATION:  A338                          FACILITY:  [facility]   PHYSICIAN:  Peter Long, M.D. DATE OF BIRTH:  03/06/1956   DATE OF PROCEDURE:  04/17/2009  DATE OF DISCHARGE:                               OPERATIVE REPORT   PROCEDURE:  ERCP with sphincterotomy and stone extraction.   INDICATIONS FOR PROCEDURE:  Peter Long is a 55 year old gentleman  admitted to the hospital with biliary colic like symptoms, dilated  biliary tree on CT with two filling defects felt to possibly be in the  cystic duct remnant (status post cholecystectomy) and a highly opaque  1700 Hounsfield unit lesion in the distal common bile duct that looks  just like one of the surgical clips at the cystic duct stump.  He has  had a marked bump in his LFTs through yesterday.  However, today they  have improved dramatically but are not normal.  ERCP is now being done  to decompress his biliary tree as appropriate.  Risks, benefits,  alternatives and limitations have been discussed with he and his wife.  We talked about the 1 in 10 chance of pancreatitis, reaction to  medications and bleeding, the potential for stent placement, the  potential for a failed procedure and the need for subsequent procedure.  All questions answered and all parties agreeable.   PROCEDURE IN DETAIL:  General anesthesia was induced by Dr. Tollie Eth and  associates.  The patient was placed in the semiprone position on the OR  table.  Instrumentation was Pentax video chip system.  Levaquin 250 mg  was given prior to the procedure.   FINDINGS:  Cursory examination of the distal esophagus and stomach  revealed no abnormalities.  The pylorus was patent and easily intubated.  The scope was pulled back to the short position 55 cm from the incisors.  A scout film was taken.  The ampulla of Vater appeared normal and  readily  identifiable medial wall second portion of duodenum.  Using the  Microvasive sphincterotome the ampulla was approached and using  guidewire palpation the duct was accessed and small amount of dye was  injected.  It turned out that we were initially in the pancreatic duct.  I withdrew and reapproached more tangentially and was able to gain rapid  access to the biliary tree with a guidewire followed up with the  sphincterotome.  Cholangiogram was performed.  There appeared to be  dilation of the common bile duct a good 8-9 mm up to the common hepatic  duct and was very tapered distally.  It is notable that otherwise status  post cholecystectomy, the residual biliary tree appeared normal.  I did  not see a radiopaque object on the cholangiogram or on the scout films  anywhere.  However, the cystic duct stump clips were readily identified.  I placed a safety wire deep in the biliary tree under fluoroscopic  control and pulled the sphincterotome back across the ampullary orifice  at the 12 o'clock position utilizing the  ERBE unit approximately 1 cm  sphincterotomy was performed without difficulty or apparent  complication.  I railed off the sphincterotome and obtained a graduated  balloon and advanced it up to the confluence.  I inflated it to a size  to accommodate the diameter of the duct and performed balloon occlusion  cholangiogram.  With the first pass the balloon came out easily covered  with a mushy yellow stone debris.  Please see photos.  Subsequently I  went back up and repeated this process once with it yielding no  additional findings.  The cystic duct remnant was not opacified.  The  biliary tree appeared to be patent after this maneuver with very good  drainage.  At that time the exam was concluded.  The patient tolerated  the procedure well and was taken to the recovery room.   I reviewed the films with Dr. Augusto Gamble afterwards and he agreed with  good ductal drainage and in  fact the bile duct appeared smaller in  diameter following sphincterotomy and balloon dredging of the biliary  tree.   IMPRESSION:  1. Dilated common bile and common hepatic duct as described above      status post endoscopic sphincterotomy with delivery of a mushy      stone material through the ampullary orifice with balloon occlusion      cholangiogram, good drainage seen after the balloon dredging and      sphincterotomy.  2. Pancreatic duct incompletely opacified but the segments opacified      appeared normal.  3. Any residual calculous material in the biliary tree should pass as      a nonevent particularly the two small filling defects seen to be      possibly in the cystic duct remnant.   RECOMMENDATIONS:  Clear liquid diet for lunch, advance as tolerated.  Hepatic profile tomorrow morning.  Hopefully Peter Long will be able to  go home in the near future.      Peter Long, M.D.  Electronically Signed     RMR/MEDQ  D:  04/17/2009  T:  04/17/2009  Job:  [id number]   cc:   Peter Long, M.D.  Fax: [phone]

## 2011-04-05 NOTE — Group Therapy Note (Signed)
NAME:  Peter Long, Peter Long NO.:  [id number]   MEDICAL RECORD NO.:  [id number]          PATIENT TYPE:  INP   LOCATION:  A338                          FACILITY:  [facility]   PHYSICIAN:  Dorris Singh, DO    DATE OF BIRTH:  Feb 05, 1956   DATE OF PROCEDURE:  04/17/2009  DATE OF DISCHARGE:                                 PROGRESS NOTE   The patient seen today after ERCP is doing well.   VITALS:  98.1, pulse 67, respirations 20, blood pressure 162/88.  GENERAL:  The patient is well-developed, well-nourished, in no acute  distress.  HEART:  Regular rate and rhythm.  LUNGS:  Clear to auscultation bilaterally.  ABDOMEN:  Soft, nontender, nondistended.  EXTREMITIES:  Positive pulses.   LABS:  CBC is within normal limits and his BNP only pertinent finding is  an elevated glucose of 216.  Liver enzymes:  His direct bili is 1.8 and  total direct bili is 0.7.  Indirect bili is 1.1.  Alk phos is 152, AST  is 163, and ALT is 174.   ASSESSMENT AND PLAN:  1. Blockage of the common bile duct with ERCP to remove obstruction.  2. Elevated liver enzymes.   Plan will be to monitor the patient tomorrow to see that his liver  enzymes are trending down and we will continue to advance his diet per  GI recommendations.      Dorris Singh, DO  Electronically Signed     CB/MEDQ  D:  04/17/2009  T:  04/17/2009  Job:  [id number]

## 2011-04-05 NOTE — Discharge Summary (Signed)
NAME:  MARTRELL, EGUIA NO.:  [id number]   MEDICAL RECORD NO.:  [id number]          PATIENT TYPE:  INP   LOCATION:  A338                          FACILITY:  [facility]   PHYSICIAN:  Lonia Blood, M.D.      DATE OF BIRTH:  [birth year]   DATE OF ADMISSION:  04/16/2009  DATE OF DISCHARGE:  05/31/2010LH                               DISCHARGE SUMMARY   PRIMARY CARE PHYSICIAN:  Lucky Cowboy, MD   DISCHARGE DIAGNOSES:  1. 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  2. Diabetes type 2.  3. Hypertension.  4. Anxiety and depression.  5. Coronary artery disease, status post automatic implantable      cardioverter-defibrillator implantation.  6. Dyslipidemia.   DISCHARGE MEDICATIONS:  1. Coreg 25 mg twice a day.  2. Digitek 0.25 mg half-tablet daily.  3. Citalopram 40 mg daily.  4. Metformin 500 mg twice a day.  5. Simvastatin 40 mg daily.  6. Lisinopril 10 mg daily.  7. Fenofibrate 160 mg daily.  8. Mirtazapine 15 mg at night.  9. Lantus 70 two units subcutaneously nightly.  10.Bupropion HCl 150 mg twice a day.  11.Diazepam 10 mg p.r.n.  12.Hydrocodone and acetaminophen 10/650 as needed.   DISPOSITION:  The patient will be discharged home.  He is able to eat or  drink.  His pain has been controlled appropriately.  Further followup  will be with Dr. Cira Servant in about a week.  The patient also has been  advised to avoid aspirin for 7 days.   PROCEDURES PERFORMED ON THIS ADMISSION:  1. Abdominal x-ray on Apr 16, 2009, that showed AICD with one of the      leads in the left subclavian valve, left lower lobe calcified      granulomata, no active disease.  Postsurgical changes in the      abdomen with no acute findings.  2. CT abdomen and pelvis also on Apr 16, 2009, showed obstructing      distal common bile duct stone with dilated intra and extrahepatic      biliary ducts.  Cystic duct with remnant stones were also present.      Some fatty infiltration of the liver.  3. A  followup chest x-ray on Apr 17, 2009, showed no active disease.      ERCP was performed by Dr. Jena Gauss on Apr 17, 2009 with stone removal.   CONSULTATION:  GI, Dr. Jena Gauss.   BRIEF HISTORY AND PHYSICAL:  Please refer to dictated history and  physical on admission by Dr. Dorris Singh.  In short, however, this  is a 55 year old gentleman that came in with abdominal pain.  The  patient has been now having severe right upper quadrant abdominal pain  with some nausea and vomiting.  His LFTs were massively elevated.  He  was somewhat jaundiced.  The patient was seen and evaluated with the CT  that confirmed common bile duct stone, which was obstructing.  GI was  subsequently consulted and the patient admitted.   HOSPITAL COURSE:  1. Obstructing common bile duct stones.  The patient  had an MRCP on      Apr 17, 2009 to remove the stone.  Since then, his function has      continued to improve.  His pain well has decreased gradually.  He      has chronic pain and he has been on hydrocodone and acetaminophen      for a while.  In the hospital, he has been on Dilaudid.  His LFTs      have trended downwards.  At this point, he is able to eat, drink,      and walk around without a problem and wants to go home, so we will      proceed with going home.  2. Diabetes.  The patient's blood sugars were controlled with his home      regimen.  3. Hypertension.  Again, his blood pressure was controlled effectively      using home regimen with no significant change.  4. Dyslipidemia.  He has been placed back on his Zocor, which he      continues to take at this point.  His other medical problems are      all chronic and no significant change now.      Lonia Blood, M.D.  Electronically Signed     LG/MEDQ  D:  04/20/2009  T:  04/20/2009  Job:  [id number]

## 2011-04-05 NOTE — H&P (Signed)
NAME:  Peter Long, KNOCH NO.:  [id number]   MEDICAL RECORD NO.:  [id number]          PATIENT TYPE:  INP   LOCATION:  A338                          FACILITY:  [facility]   PHYSICIAN:  Dorris Singh, DO    DATE OF BIRTH:  [birth year]   DATE OF ADMISSION:  04/16/2009  DATE OF DISCHARGE:  LH                              HISTORY & PHYSICAL   CHIEF COMPLAINT:  Abdominal pain.   The patient is a 55 year old Caucasian male who presented to the [facility] Emergency Room complaining of this abdominal pain that started  about a week ago, is insidious in pattern.  It was persistent and states  that he has been throwing up as well and felt like he had abdominal  pain, too, from the soreness in his abdomen.  The patient states that he  has not had any recent alcohol, constipation, diarrhea, dysuria, fever,  hematemesis, melena, nausea, vomiting and he was not treated in the ED  for this previously.  Generally, he stated that he was in fair health  prior to this.   PAST MEDICAL HISTORY:  1. Hypertension.  2. Anxiety.  3. Diverticulitis.  4. Hernia.  5. MRSA.   SURGICAL HISTORY:  1. AICD implantation.  2. Appendectomy.  3. Cholecystectomy.  4. Hernia repair.  5. He had his defibrillator placed after he had abdominal surgery from      diverticulitis.   SOCIAL HISTORY:  He is a smoker.  He smokes a pack a day, nondrinker, no  alcohol use.   He has no known drug allergies.   CURRENT LIST OF MEDICATIONS:  He is on:  1. Coreg 12.5 twice a day.  2. Digitek 0.25 once a day.  3. Bupropion HCl 150 mg twice a day.  4. Diazepam 10 mg as needed.  5. Klor-Con 20 mEq once a day.  6. Gemfibrozil 600 mg twice a day.  7. Glyburide/metformin 5/500 twice a day.  8. Hydrocodone/acetaminophen 10/650.   REVIEW OF SYSTEMS:  As mentioned above in the HPI.   PHYSICAL EXAMINATION:  CURRENT VITALS ARE AS FOLLOWS:  Temperature 97.5,  pulse 79, respirations 20, blood pressure 134/74.  GENERAL:  The patient is a well-developed, well-nourished Caucasian male  who looks his stated age.  HEAD:  Normocephalic, atraumatic.  EYES:  EOMI.  PERLA.  EARS, NOSE, MOUTH, THROAT:  Within normal limits.  NECK:  Supple, no lymphadenopathy.  HEART:  Regular rate and rhythm.  No murmur, rub or gallop.  RESPIRATORY:  Clear to auscultation bilaterally.  No rales, wheezes or  rhonchi.  ABDOMEN:  Soft, nontender, nondistended.  Bowel sounds in all 4  quadrants.  EXTREMITIES:  Positive pulses.  No edema, ecchymosis or cyanosis.  NEURO:  A plus O x3.  Cranial nerves II-XII grossly intact.   Electrocardiogram shows normal with intraventricular conduction delay  with normal sinus rhythm.  His troponins are negative.  BMETs:  There  are no pertinent positives other than his glucose being 236 and his  white count 13.5, hemoglobin 18.0, hematocrit 5.8.  Urine is nitrate  negative and leukocyte  esterase negative.  Alkaline phos is 191,  bilirubin total is 5.7, direct is 3.5 and indirect is 2.2, all are  elevated, lipase is 23.  His acute abdominal series demonstrates AICD  with one of the leads in the left subclavian vein as before, left lower  lobe calcified granulomata, no active disease, postsurgical changes in  the abdomen with no acute findings.  CT of the abdomen:  Obstructing  distal common bile duct stone with dilated intrahepatic and extrahepatic  biliary ducts as discussed above, cystic duct remnant stones are also  present, fatty infiltration of liver, subcentimeter left renal lesion  too small to adequately characterize, these are new compared to 2004,  stability will need to be confirmed on follow-up, broad-based anterior  abdominal wall hernia contains bowel but does not cause entrapment of  bowel.   ASSESSMENT/PLAN:  1. Abdominal pain.  2. Possible obstruction of his common bile duct and history of      hypertension.   PLAN:  1. Admit patient to the service of [facility].   2. We will consult GI, who was consulted via the ED, and will place      the patient on home medications, will do DVT and GI prophylaxis.  3. Will get labs in the morning.  4. Due to his heart condition, will also get electrocardiogram and      some cardiac enzymes and continue to monitor him throughout the      night before his prescribed procedure by GI.  5. Will continue to monitor patient and change therapy as necessary.      Dorris Singh, DO  Electronically Signed     CB/MEDQ  D:  04/16/2009  T:  04/16/2009  Job:  [id number]

## 2011-04-05 NOTE — Consult Note (Signed)
NAME:  Peter Long, Peter Long               ACCOUNT NO.:  [id number]   MEDICAL RECORD NO.:  [id number]          PATIENT TYPE:  INP   LOCATION:  A338                          FACILITY:  [facility]   PHYSICIAN:  R. Roetta Sessions, M.D. DATE OF BIRTH:  05-26-1956   DATE OF CONSULTATION:  04/16/2009  DATE OF DISCHARGE:                                 CONSULTATION   REASON FOR CONSULTATION:  Dilated bile duct, possible common bile duct  stone on CT, elevated liver function tests.   HISTORY OF PRESENT ILLNESS:  [name] is a pleasant 55-year-  old Caucasian male followed primarily by Dr. Yetta Numbers who over the  past several weeks has had colicky intermittent epigastric,  periumbilical pain with intermittent postprandial nausea and vomiting.  He was seen here at the ED on Apr 08, 2009.  He had a noncontrast CT  which was unrevealing as the cause of his symptoms.  Demonstrated viral  infection.  His symptoms persisted.  He returned to the emergency  department today where he was evaluated by Dr. Margretta Ditty.  A contrast CT  of the abdomen and pelvis demonstrated interim dilation of the biliary  tree with at least 2 small filling defects that appear to be more in the  cystic duct than the common bile duct, although common hepatic duct is  not excluded as the location.  Also, there is an opaque lesion in the  distal common bile duct that almost has a shape and appearance of a  laparoscopy surgical clip.  It appears to be impacted distally.  There  is no obvious stricture.  The pancreas looked okay.  I have reviewed the  films personally with Dr. Bridgett Larsson.  His gallbladder was removed back  in 2010.  Of note, his liver enzymes back on Apr 08, 2009 revealed an  AST of 177, an ALT of 214, total bilirubin was 1.2, alkaline phosphatase  92.  Today, his total bilirubin is 5.7, direct 3.5, alkaline phosphatase  191, AST 315, ALT 591, albumin 4.0.  Lipase on Apr 08, 2009 was 23, and  it is 23 today as  well.  CBC today reveals a white count of 13.5, H and  H 15.0 and 50.8, MCV 90.7, platelet count 218,000.   PAST MEDICAL HISTORY:  1. Diabetes mellitus.  2. Hypertension.  3. Coronary artery disease status post anterior MI in the past (per      outpatient history).  He had an AICD placed in 2008.  He has been      followed historically by Dr. Kem Boroughs from [facility]      Cardiology.   PAST SURGICAL HISTORY:  1. Status post cervical spine surgery in 2002.  2. Appendectomy.  3. Cholecystectomy.  4. Bilateral herniorrhaphies.  5. Status post segmental colectomy for complicated diverticulitis.   He has had a colonoscopy by Dr. Karilyn Cota several years ago as per reports.  No history of peptic ulcer disease.   MEDICATIONS ON ADMISSION:  Coreg, Digitek, bupropion, Niaspan, Klor-Con  M20, gemfibrozil, glyburide, metformin, hydrocodone, acetaminophen  p.r.n.   ALLERGIES:  No  known drug allergies.   FAMILY HISTORY:  No chronic GI or liver illnesses.   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  He [place].  He has horses and a dog.  He has 3 grown children and a wife.  He smokes one pack of cigarettes a  day and does not use any alcohol.   REVIEW OF SYSTEMS:  No melena, no hematochezia.  No odynophagia,  dysphagia, early satiety.  He is able to walk a good 5-6 blocks without  getting winded.  He really has not had any chest pain on exertion.  His  AICD has never discharged.  He has not had any fever or chills.  No clay-  colored stools, no dark-colored urine today.   PHYSICAL EXAMINATION:  He is alert, conversant, in no acute distress.  Temperature 97.5, pulse 79, respiratory rate 20, BP 134/74, O2  saturation 99.  SKIN:  Warm and dry.  There is no jaundice.  HEENT:  No scleral icterus.  Conjunctivae pink.  CHEST:  Lungs clear to auscultation bilaterally.  He has expiratory and  inspiratory wheezes.  Good air movement  maintained.  CARDIAC:  Regular rate and rhythm without murmurs, gallops, or rubs.  ABDOMEN:  Nondistended.  Positive bowel sounds.  He has some epigastric  tenderness to palpation.  No obvious mass or organomegaly.  EXTREMITIES:  No edema.   IMPRESSION:  [name] is a very pleasant 55 year old gentleman with  colicky-like epigastric pain, intermittent nausea and vomiting for the  past several weeks.  Recent serial CTs have demonstrated impressive  progressive biliary dilation with at least 2 filling defects in the  upstream biliary tree.  He had a very unusual appearing abnormality in  the distal common bile duct which may in fact represent a surgical clip  related to his prior cholecystectomy.  This is very unusual but is  likely the major culprit in producing an extrahepatic biliary  obstruction.  [name] does not have cholangitis at this time.  There  is no evidence of pancreatitis.  He needs biliary decompression via an  ERCP.  He will likely need a sphincterotomy and/or stone extraction as  appropriate.  I have discussed this approach with [name].  The risks,  benefits, and alternatives have been reviewed.  We talked about the  risks of bleeding, perforation, a 1 in 10 chance of pancreatitis,  potential for a stent which may necessitate a subsequent procedure.  We  also talked about the small chances of a failed attempt here and the  need to be transferred elsewhere.  His questions were answered.  He is  agreeable.  We will tentatively plan to schedule him in the OR tomorrow.  Will discuss with anesthesia.  I have discussed with Dr. Elige Radon.  She  will obtain EKG and is going to give [name] some nebulizer  treatments.  We will defer to anesthesia whether or not they would like  cardiology to see him prior to embarking on the procedure.  I will plan  to give him a dose of Levaquin 250 mg IV preoperatively.  Further  recommendations to follow.   I would like to thank Dr.  Elige Radon for allowing me to see this nice  gentleman today.      Jonathon Bellows, M.D.  Electronically Signed    RMR/MEDQ  D:  04/16/2009  T:  04/16/2009  Job:  [id number]   cc:   Kirk Ruths, M.D.  Fax: [phone]

## 2011-04-08 NOTE — Discharge Summary (Signed)
NAME:  Peter Long, Peter Long NO.:  [id number]   MEDICAL RECORD NO.:  [id number]                   PATIENT TYPE:  INP   LOCATION:  A223                                 FACILITY:  [facility]   PHYSICIAN:  Kirk Ruths, M.D.            DATE OF BIRTH:  07/08/1956   DATE OF ADMISSION:  04/06/2004  DATE OF DISCHARGE:  04/14/2004                                 DISCHARGE SUMMARY   DISCHARGE DIAGNOSES:  1. Septicemia secondary to Methicillin-resistant Staph aureus.  2. Poorly controlled type 2 diabetes converted to insulin.  3. Severe cardiomyopathy.  4. Chronic gluteal abscess, status post incision and drainage.  5. Re-cellulitis of the left foot.  6. Dyslipidemia.   HOSPITAL COURSE:  This 55 year old white male was admitted through the  office after a several day history of fever and sweats after being treated  with outpatient Cleocin and Cipro for gluteal abscess.  The patient has been  progressively poorly controlled with his diabetes on maximum p.o.  medications.  Also, he had some sweats and chest pains.  His EKG showed some  ischemia in the anterolateral leads.  He was admitted for possible sepsis,  I&D of his abscess, transition from p.o. to insulin and rule out MI.  The  patient was admitted to the floor and begun on IV Rocephin and Lantus  insulin and sliding scale.  Blood cultures were obtained and eventually grew  out MRSA.  The patient was seen in consultation by Dr. Malvin Johns who I&D'd  his gluteal abscess.  The patient also was seen by cardiology, Dr. Domingo Sep,  and underwent an echocardiogram which showed an ejection fraction of less  than 20%.  The patient's cardiac enzymes were negative serially times three.  His initial white count was 15,000 on admission and this defervesced to 9.9.  Hemoglobin went from 17.7 to 12.9 during this stay.  Electrolytes were  basically within normal range with some slightly low sodium at 132, but  otherwise fairly  normal.  BUN and creatinine were 7.0 and 0.8.  The patient  had an albumin which was slightly low at 2.7.  Total protein was 5.8 and  there was a mildly elevated AST and ALT being 47 and 74 on discharge.  His  glycosylated hemoglobin A1c on admission was 10.9 reflecting poor control of  his diabetes.  The patient's cholesterol was 85 with triglycerides being  177, HDL of 29 and LDL of 21.  The patient's EKG showed nonspecific ST  changes.  Chest x-ray showed a tiny nodular opacity in the superior cortex  of the right middle clavicle with recommended follow-up in three months.  X-  ray of his left foot was negative.  Venous Doppler studies were negative.  The patient's fever slowly defervesced.  His blood sugars improved.  The  patient was still somewhat weak.  Smoking was discontinued during his stay.  The patient had reached maximal hospital  benefits and was discharged on 30  units Lantus insulin after receiving diabetic teaching.  He was given Lorcet  Plus for pain.  He was continued on his Valium, Lorcet Plus and gemfibrozil.  The patient was arranged to have outpatient vancomycin per intravenously for  the next two weeks.   FOLLOW UP:  1. The patient is to follow-up with Dr.  Domingo Sep of cardiology.  2. He is to follow-up with Dr. Malvin Johns for his gluteal abscess.  3. He will be followed in my office in one week.   DISPOSITION:  The patient is stable at the time of discharge.     ___________________________________________                                         Kirk Ruths, M.D.   WMM/MEDQ  D:  05/10/2004  T:  05/10/2004  Job:  [id number]   cc:   Dani Gobble, MD  Fax: [phone]   Barbaraann Barthel, M.D.  [address]  [place]  [place] [address]  Fax: [phone]

## 2011-04-08 NOTE — Group Therapy Note (Signed)
   NAME:  Peter Long, RAMUS                         ACCOUNT NO.:  [id number]   MEDICAL RECORD NO.:  [id number]                   PATIENT TYPE:  INP   LOCATION:  IC06                                 FACILITY:  [facility]   PHYSICIAN:  Edward L. Juanetta Gosling, M.D.             DATE OF BIRTH:  05/03/1956   DATE OF PROCEDURE:  05/12/2003  DATE OF DISCHARGE:                                   PROGRESS NOTE   SUBJECTIVE:  Mr. Hansley had an episode of becoming very confused last night.  He received more Ativan.  I discussed all this with his wife and she says  that his mother had an odd reaction to anesthesia and she wonders if he has  had the same thing.  He does take approximately four Valium in one month so  I do not think that a withdrawal from medications is likely and he does not  drink any alcohol, so I do not think that is the problem either.  At any  rate, he is better this morning.  He is awake, he is alert, he is oriented,  and seems to be doing okay.   OBJECTIVE:  His exam shows that his pulse is 103, blood pressure 130/83, O2  saturation in the high 90s.  His blood gas shows a PO2 of 73, PCO2 of 48, pH  7.39.  His chest is quite clear, his heart is regular but with a mild  tachycardia, abdomen fairly soft, and he says that he feels some grumbling.   ASSESSMENT:  He has multiple problems but seems to be doing better.   PLAN:  Continue with support of his respirations, etc.  He is off the  ventilator so that, of course, is good news, and he is using incentive  spirometer.  Dr. Malvin Johns has already increased his Ativan which I think is  reasonable, and he seems better.  I do think this is likely a reaction to  anesthesia.                                               Edward L. Juanetta Gosling, M.D.    ELH/MEDQ  D:  05/12/2003  T:  05/12/2003  Job:  [id number]

## 2011-04-08 NOTE — Group Therapy Note (Signed)
   NAME:  Peter Long, Peter Long                         ACCOUNT NO.:  [id number]   MEDICAL RECORD NO.:  [id number]                   PATIENT TYPE:  INP   LOCATION:  IC06                                 FACILITY:  [facility]   PHYSICIAN:  Edward L. Juanetta Gosling, M.D.             DATE OF BIRTH:  04/04/1956   DATE OF PROCEDURE:  DATE OF DISCHARGE:                                   PROGRESS NOTE   PROBLEM:  Respiratory failure following abdominal surgery.   SUBJECTIVE:  [name] says he is feeling better.  He has no new complaints.  He does have other problems including COPD and diabetes.  He says he feels  great.  He has been up and moving around.  He has been eating.   OBJECTIVE:  VITAL SIGNS:  Pulse 103, blood pressure 128/89.  Blood sugars  have ranged between 183 and 203.  CHEST:  On exam today, his chest is very clear.  HEART:  Regular.   LABORATORY DATA:  Electrolytes show a sodium of 147, CO2 37, potassium 4.1,  chloride 105, glucose 174, BUN 15, creatinine 1.2.   ASSESSMENT:  Overall, things seem to be going quite well.   PLAN:  It is okay with me for him to try out of the intensive care unit from  a respiratory standpoint.                                               Edward L. Juanetta Gosling, M.D.    ELH/MEDQ  D:  05/14/2003  T:  05/14/2003  Job:  [id number]

## 2011-04-08 NOTE — Cardiovascular Report (Signed)
NAME:  Peter Long, Peter Long NO.:  [id number]   MEDICAL RECORD NO.:  [id number]          PATIENT TYPE:  INP   LOCATION:  4707                         FACILITY:  [facility]   PHYSICIAN:  Janeece Riggers. Severiano Gilbert, M.D.    DATE OF BIRTH:  Dec 25, 1955   DATE OF PROCEDURE:  11/04/2004  DATE OF DISCHARGE:                              CARDIAC CATHETERIZATION   PROCEDURES PERFORMED:  1.  Single chamber ICD implantation.  2.  Device testing.  3.  Insertion of sheath into superior vena cava to do left axillary venogram      for placement of left axillary shocking coil.  4.  Venography.   PREPROCEDURE DIAGNOSES:  1.  Congestive cardiomyopathy, ejection fraction less than 30%, chronic.  2.  Primary prevention of sudden cardiac death.   PROPROCEDURE DIAGNOSES:  1.  Congestive cardiomyopathy, ejection fraction less than 30%, chronic.  2.  Primary prevention of sudden cardiac death.   OPERATOR:  Launa Grill, M.D.   COMPLICATIONS:  None.   ESTIMATED BLOOD LOSS:  Less than 50 mL.   PROCEDURE IN DETAIL:  Patient brought to the cardiac catheterization  laboratory in a fasting state.  Patient was prepped and draped over the  right pectoral region in a normal sterile manner.  Conscious sedation with  intermittent injection with __________  and fentanyl.  Vancomycin 1 g IV  immediate prior to skin injection for antimicrobial prophylaxis.  Local  anesthesia 1% subcutaneous lidocaine.  A 6 cm incision 2 cm below the right  clavicle crossing in the right deltopectoral groove was made to the level of  prepectoral fascia with a combination of sharp and blunt dissection.  Inferior __________  incision along the prepectoral fascia appropriately  sized ICD pocket was formed.  Hemostasis obtained by cautery and copiously  irrigated with antibiotic containing solution.  Next, the cephalic vein was  located by dissection in the deltopectoral groove, distally ligated,  conserved for a cutdown approach to  introduce a [employer] Jude passive fixation  dual coil ICD lead model #1570 serial #[id number] which was advanced first into  the IVC, then using a curved stilette placement in the pulmonary outflow  tract and finally with a straight stilette positioned within the right  ventricular apex.  After electronic testing the device was connected to a  St. Jude model #V193 serial [id number] single chamber ICD which was placed  within the preformed pocket.  A single episode of ventricular fibrillation  was induced using a T-wave shock protocol which failed to be converted  internally with both 20 and 25 joules.  A single external 200 joule  cardioversion shock resulted in resumption of sinus rhythm.  The generator  was explanted from the pocket.  The SVC coil was removed from the shocking  electrode and then capped.  __________  to the right pectoral implant site a  9-French sheath system was introduced percutaneously through the pocket into  the right axillary vein without difficulty.  Using an 0.35 wire through a  Judkins right 4 catheter after venography the J-tip wire was positioned  within the left axillary vein.  A Safe sheath was advanced into the left  axillary vein and via the Safe sheath a Medtronic SVC coil model [id number]  serial #[id number] was advanced fluoroscopically into the left axillary  vein.  The Safe sheath system was removed and lead stability was confirmed  by radiography.  The SVC coil which was positioned in the left axillary vein  was then connected in the SVC position of the shocking device.  Device was  reimplanted in the ICD pocket.  Fibrillation testing was repeated with T-  wave shock with successful conversion to sinus rhythm with 25 joules.  The  wound was closed in layers with 2-0 and 4-0 Vicryl.  The skin incision was  reinforced by Steri-Strips and a light bulky pressure dressing applied.  Patient tolerated procedure well and was returned to the stepdown area in  stable  hemodynamic condition.   Electronic characteristics through the analyzer demonstrated an R-wave  amplitude of 9 millivolts, impedance 552 ohms, capture threshold 0.5 volts  at 0.5 milliseconds.  There was absence of diaphragmatic pacing.  The  successful shock was tested at a sensitivity of 0.6 with very good sensing  during induction of the after T-wave shock.  25 joule shock resulted in a  type A break to sinus rhythm.  High energy shock impedance was 53 ohms.  Charge time was approximately 8 seconds.  The device was programmed into a  single zone of therapy for a VF of greater than 182 beats per minute.  Patient received maximum output shocks of 35 joules from a can.  Brady  pacing is programmed DVI at 40, bipulse ,5.  The sensitivity device was  programmed down to 0.3 which is normal setting.       MEP/MEDQ  D:  11/04/2004  T:  11/04/2004  Job:  [id number]

## 2011-04-08 NOTE — H&P (Signed)
   NAME:  Peter Long, DUROSS NO.:  [id number]   MEDICAL RECORD NO.:  [id number]                   PATIENT TYPE:  INP   LOCATION:  A319                                 FACILITY:  [facility]   PHYSICIAN:  Kirk Ruths, M.D.            DATE OF BIRTH:  [birth year]   DATE OF ADMISSION:  01/27/2003  DATE OF DISCHARGE:                                HISTORY & PHYSICAL   CHIEF COMPLAINT:  Abdominal pain for a week.   HISTORY OF PRESENT ILLNESS:  This is a 55 year old white male who has a past  medical history of diverticulitis, hernia repair.  The patient presents to  the office complaining of a one-week history of progressive lower abdominal  pain associated with fever, chills, and constipation.  In the office, he was  noted to have a temperature of 100.  The abdomen had diminished bowel  sounds, diffusely tender, positive rebound.  He will be admitted for acute  abdomen.   PAST MEDICAL HISTORY:  1. Type 2 diabetes for which he takes Glucovance 5/500 b.i.d., Avandia 4 mg     daily.  2. Hyperlipidemia for which he takes Lescol 80 mg daily.  3. The patient also takes Valium p.r.n.   REVIEW OF SYSTEMS:  Noncontributory.   PHYSICAL EXAMINATION:  GENERAL:  Obese white male who appears miserable.  VITAL SIGNS:  Temperature 100, blood pressure 150/94, pulse 100 and regular,  respirations 18.  HEENT:  Tympanic membranes normal.  Pupils equal, round, reactive to light  and accommodation.  Oropharynx benign.  NECK:  Supple.  Without JVD, bruit, or thyromegaly.  LUNGS:  Clear in all areas.  HEART:  Normal sinus rhythm without murmurs, gallops, or rubs.  ABDOMEN:  Diminished bowel sounds.  Diffuse tenderness, especially in the  lower abdomen.  Patient with rebound in the lower abdomen.  EXTREMITIES:  Without clubbing, cyanosis, or edema.  NEUROLOGIC:  Intact.   ASSESSMENT:  1. Acute abdominal pain without perforation probably secondary to     diverticulitis.  2.  Elevated blood pressure.  3. Type 2 diabetes.  4. Hyperlipidemia.                                               Kirk Ruths, M.D.   WMM/MEDQ  D:  01/27/2003  T:  01/27/2003  Job:  [id number]

## 2011-04-08 NOTE — Group Therapy Note (Signed)
   NAME:  Peter Long, Peter Long                         ACCOUNT NO.:  [id number]   MEDICAL RECORD NO.:  [id number]                   PATIENT TYPE:  INP   LOCATION:  IC06                                 FACILITY:  [facility]   PHYSICIAN:  Edward L. Juanetta Gosling, M.D.             DATE OF BIRTH:  [birth year]   DATE OF PROCEDURE:  05/13/2003  DATE OF DISCHARGE:                                   PROGRESS NOTE   SUBJECTIVE:  Mr. Berhane says he had a fair night last night.  He has no new  complaints.  He is coughing and coughing up some sputum.   OBJECTIVE:  His exam shows that his heart rate is about 110 in sinus rhythm,  blood pressure 138/82, O2 saturation in the 94-98% range.  He is coughing  and has a good productive cough.  His chest still shows rhonchi bilaterally  but less than yesterday.  His abdomen is soft, extremities showed no edema.  His BMET today shows that his CO2 is 37, glucose 174, sodium is a little  high at 147.  His CBC shows his white count 13,500; hemoglobin 15.5;  platelets 280.   ASSESSMENT:  He is much improved.   PLAN:  Continue with treatments, progressive diet, ambulation, etc.                                               Edward L. Juanetta Gosling, M.D.    ELH/MEDQ  D:  05/13/2003  T:  05/13/2003  Job:  [id number]

## 2011-04-08 NOTE — Procedures (Signed)
   NAME:  Peter Long, Peter Long NO.:  [id number]   MEDICAL RECORD NO.:  [id number]                   PATIENT TYPE:  INP   LOCATION:  IC06                                 FACILITY:  [facility]   PHYSICIAN:  [place] Bing, M.D.               DATE OF BIRTH:  Aug 29, 1956   DATE OF PROCEDURE:  05/08/2003                  AGE:  55  DATE OF DISCHARGE:                              SEX:  M                                CARDIAC ULTRASOUND   REFERRING PHYSICIANS:  Barbaraann Barthel, M.D. and Oneal Deputy. Juanetta Gosling, M.D.   CLINICAL INFORMATION:  A 55 year old gentleman with pulmonary edema.   M-MODE:  AORTA:  3.0  (<4.0)  LEFT ATRIUM:  4.4  (<4.0)  SEPTUM:  1.5  (0.7-1.1)  POSTERIOR WALL:  1.7  (0.7-1.1)  LV-DIASTOLE:  7.2  (<5.7)  LV-SYSTOLE:  6.6  (<4.0)   FINDINGS/IMPRESSION:  1. A technically adequate echocardiographic study.  2. Mild left atrial enlargement; normal right atrial size.  3. Normal internal dimension of the right ventricle; mild RVH.  Normal RV     systolic function.  4. Minimal aortic valvular sclerosis; mild annular calcification.  5. Normal tricuspid valve.  6. Normal pulmonic valve.  7. Filled mitral valve with mild-to-moderate regurgitation; mild MAC.  8. Moderate left ventricular dilatation; mild-to-moderate hypertrophy.     Global hypokinesis with akinesis of the antral lateral and inferior     septal segments.  Only the inferolateral wall at the base functions     relatively normally.  Overall systolic function is severely impaired with     an estimated ejection fraction of 0.25.  9. Normal IVC.                                                Bing, M.D.    RR/MEDQ  D:  05/08/2003  T:  05/09/2003  Job:  [id number]

## 2011-04-08 NOTE — Op Note (Signed)
NAME:  Peter Long, Peter Long NO.:  [id number]   MEDICAL RECORD NO.:  [id number]                   PATIENT TYPE:  INP   LOCATION:  IC06                                 FACILITY:  [facility]   PHYSICIAN:  Barbaraann Barthel, M.D.              DATE OF BIRTH:  August 22, 1956   DATE OF PROCEDURE:  05/07/2003  DATE OF DISCHARGE:                                 OPERATIVE REPORT   PROCEDURE:   NOTE:  This is a 55 year old white male who was admitted to my service for  recurrent diverticular disease.  He has had at least 5 or 6 bouts of  diverticular disease requiring, at times, hospitalization.  He is admitted  for elective sigmoid resection.  He had obtained, preoperatively a  mechanical and an antibiotic prep.  We had discussed the procedure in detail  with him including complications not limited to, but including bleeding  infection and anastomotic leaks and informed consent was obtained.   GROSS OPERATIVE FINDINGS:  The patient had an area of sigmoid colon which  was adherent to the bladder and was an obvious area of diverticulosis.  No  acute inflammation or abscess was encountered.  The patient also had severe  adhesions around a previous ventral hernia repair where mesh was adhered to  the small bowel.  This mesh was removed.  There were no obvious recurrent  hernia in this area other than just the severe adhesions to a previous  incisional herniorrhaphy with the use of mesh.   Gross operative findings, as stated, dense small-bowel adhesions around a  previous incisional herniorrhaphy, an area of thickened diverticular disease  in a segment just above the sacral promontory; and the rest of the abdominal  exploration was grossly within normal limits.   TECHNIQUE:  The patient was placed in the supine position and after the  adequate administration of general anesthesia by endotracheal intubation a  Foley catheter was aseptically inserted and the abdomen was prepped  with  Betadine solution and draped in the usual manner.  A midline incision was  carried out above the umbilicus, down to the symphysis, opening the  abdominal cavity in the midline and exploring the abdominal cavity with the  above findings.  The adhesions were taken down very tediously and carefully  to avoid any damage to the small bowel.  No damage was done.  There was an  area of previous adherence where the incisional herniorrhaphy was.  This was  removed as the bowel was coming in contact with a portion of old mesh and  Prolene suture.   We then dissected free the left colon freeing this up all the way up to the  splenic flexure and then freeing up the portion of the rectum bringing this  out of the pelvic cavity after taking down the adhesions that were around  this particularly affected loop of sigmoid bowel.  The bowel was  then  divided proximally with the GIA stapling device and divided distally with a  TA-55 stapling device and clamping the proximal portion of the bowel with a  bowel clamp to avoid any spillage whatsoever.  The sigmoid vessels were then  ligated with 2-0 silk and using the LDS stapler as well.  This specimen was  sent.   We then performed a stapled side-to-side anastomosis and closed the  anastomosis with a TA-55 stapler.  This suture line was oversewn with 3-0  silk and we buttressed this with epiploic fat. We then irrigated with normal  saline solution after changing gloves, and checked for hemostasis and then  closed with wound with a 00 Novofil suture.  The subcu was irrigated and the  skin was closed with a stapling device.   Prior to closure all sponge, needle, and instrument counts were found to be  correct.  Estimated blood loss was approximately 600 cc.  There was no  active bleeding, but the surgery was one of oozing and a prolonged surgery  of almost 4 hours in duration.  The patient received 6000 cc of crystalloids  intraoperatively.  No drains  were placed, and the patient was taken back to  the recovery room.  He had to be reintubated as this patient is a heavy  smoker and then we will plan to follow him in the intensive care unit.  There were no intraoperative complications otherwise.                                               Barbaraann Barthel, M.D.    WB/MEDQ  D:  05/07/2003  T:  05/07/2003  Job:  [id number]   cc:   Kirk Ruths, M.D.  [address]  [place]  [place] [address]  Fax: [phone]   Oneal Deputy. Juanetta Gosling, M.D.  [address]  [place]  [place] [address]  Fax: [phone]

## 2011-04-08 NOTE — Discharge Summary (Signed)
NAME:  Peter Long, FITZNER NO.:  [id number]   MEDICAL RECORD NO.:  [id number]                   PATIENT TYPE:  INP   LOCATION:  A301                                 FACILITY:  [facility]   PHYSICIAN:  Barbaraann Barthel, M.D.              DATE OF BIRTH:  Jul 12, 1956   DATE OF ADMISSION:  05/06/2003  DATE OF DISCHARGE:  05/16/2003                                 DISCHARGE SUMMARY   DIAGNOSIS:  Diverticular disease.   SECONDARY DIAGNOSES:  1. Obesity.  2. Type 2 diabetes mellitus.  3. Tobacco abuse.  4. Hypercholesterolemia.   PROCEDURE:  On May 07, 2003, sigmoid resection with side to side stapled  anastomosis.   CONSULTATIONS:  Dr. Juanetta Gosling regarding respiratory failure.   HISTORY:  This is a 55 year old white male who had at least five episodes of  acute diverticulitis. We had planned for an elective resection for him, and  this was scheduled. We began his bowel prep as an outpatient and continued  this in the hospital and with surgery planned for the next day. Surgery went  well. He had a sigmoid resection performed with staple anastomosis. This was  unremarkable. Postoperatively, however, the patient did suffer complications  of respiratory failure due to his heavy cigarette use and his obesity  requiring him to be in the intensive care unit and on the ventilator. He was  followed by Dr. Juanetta Gosling for this and was essentially on the ventilator for  the first four days or so postoperatively, after which he did well except  for some confusion with morphine in the intensive care unit, but his  sensorium later cleared, and he was discharged from the intensive care unit  on approximately the sixth postoperative day. At that time, he was also  passing gas, and his NG tube was removed. He continued to do well from a  respiratory standpoint as well, and his diet was further advanced as  tolerated. His wounds will remain clean any signs of infection. He had bowel  movements without any problem, and he was discharged finally on ninth  postoperative day. At that time, he was breathing well, and his wound was  clean, and he had no GI or GU complaints. We have made arrangements for  followup with preoperatively.   LABORATORY DATA:  For respiratory parameters, etc., please contact Dr.  Zadie Cleverly note. His blood gases were followed appropriately when he was on  the ventilator, as was his chest x-rays which showed resolving postoperative  atelectasis as he was followed. On May 13, 2003, his white count was 13.5  with a H and H of 15.5 and 45.2. His electrolytes were within normal limits.  Glucose was 174. We resumed his diabetic medications postoperatively and  followed him perioperatively as needed with a sliding scale regular insulin  coverage. Pathology report is not on this chart at the time of this  dictation.   DISCHARGE  INSTRUCTIONS:  He is told to clean his wound with alcohol three  times a day. He is given Darvocet-N 100 one tablet every four hours as  needed for pain. He was told to take Colace 100 mg daily as needed and uses  incentive spirometry machine, and he is advised strongly to stop smoking. He  is excused from work. He is  told to increase his activities as tolerated. He is permitted to shower and  walk up and down the stairs. He is told to do no driving, no heavy lifting,  or no sexual activity in the immediate postoperative period. We have made  followup arrangements to follow him perioperatively. He is told to contact  us if there are any acute changes.     ___________________________________________                                         Barbaraann Barthel, M.D.   WB/MEDQ  D:  10/20/2003  T:  10/20/2003  Job:  [id number]

## 2011-04-08 NOTE — Procedures (Signed)
NAME:  Peter Long, Peter Long                         ACCOUNT NO.:  [id number]   MEDICAL RECORD NO.:  [id number]                   PATIENT TYPE:  INP   LOCATION:  A326                                 FACILITY:  [facility]   PHYSICIAN:  Dani Gobble, MD                    DATE OF BIRTH:  May 19, 1956   DATE OF PROCEDURE:  DATE OF DISCHARGE:                                  ECHOCARDIOGRAM   REFERRING PHYSICIAN:  1. Dr. Dionne Ano. Regino Schultze.  2. Dr. Madaline Savage.   INDICATION:  Peter Long is a 55 year old gentleman with a long history of  diabetes mellitus who was admitted with chest discomfort and a febrile  illness.  He is referred for echocardiogram to evaluate left ventricular  systolic function.   TECHNICAL QUALITY:  The technical quality of the study is adequate.   FINDINGS:  1. The aorta is within normal limits at 3.3 cm.  2. The left atrium is mildly dilated at 4.5 cm.  No obvious clots or masses     were appreciated, and the patient appeared to be in sinus rhythm during     this procedure.  3. The intraventricular septum and posterior wall are mild-to-moderately     thickened.  4. The aortic valve is probably trileaflet with normal leaflet excursion.     No significant aortic insufficiency is noted.  Doppler interrogation of     the aortic valve is within normal limits.  5. The mitral valve also appears mildly thickened on the anterior leaflet,     otherwise is structurally normal.  Leaflet excursion was somewhat     diminished, consistent with decreased cardiac output.  Additionally, the     presence of increased E point septal separation was also noted, again     consistent with decreased cardiac output.  No mitral valve prolapse is     noted.  Doppler interrogation of the mitral valve is within normal     limits.  Mitral regurgitation is present which appeared to be moderate by     color flow Doppler.  However, I can not exclude the possibility of     pulmonary vein flow reversal  suggestive of a more severe nature of this     regurgitation.  6. The pulmonic valve appeared grossly structurally normal.  7. The tricuspid valve also appeared grossly structurally normal with mild     tricuspid regurgitation noted.  8. The left ventricle was dilated with the LBIDD measured at 7.2 cm and the     LVISD measured at 6.3 cm.  Overall, left ventricular systolic function     was severely diminished with an ejection fraction estimated at less than     20%.  The anterior septal wall appeared severely hypokinetic to akinetic.     The lateral wall including the apex appeared to be moderately     hypokinetic, while the  remaining walls also appeared to be severely     hypokinetic.  The right ventricle is normal in size and right ventricular     systolic function appeared reasonable. The right atrium also appeared     normal in size.   IMPRESSION:  1. Mild-to-moderate concentric left ventricular hypertrophy.  2. Mild left atrial dilatation.  3. Mildly-thickened anterior leaflet of the mitral valve with decreased     leaflet excursion, likely secondary to decreased cardiac output.  4. Increased E point septal separation also suggestive of decreased cardiac     output.  5. Mitral regurgitation which appears moderate by color flow Doppler, but     may be more severe given the suggestion of pulmonary vein flow reversal.  6. Mild tricuspid regurgitation.  7. Significant left ventricular dilatation with severely-diminished ejection     fraction estimated at less than 20%.  The anterior septal wall appears     severely hypokinetic to akinetic:  Lateral wall appears moderately     hypokinetic to include a portion of the apex, and the remaining wall     appears severely hypokinetic.  8. The right ventricle appears normal in size with reasonable right     ventricular systolic function.      ___________________________________________                                            Dani Gobble, MD   AB/MEDQ  D:  04/07/2004  T:  04/08/2004  Job:  [id number]   cc:   Kirk Ruths, M.D.  [address]  [employer]  [place] [address]  Fax: [phone]   Madaline Savage, M.D.  [phone], [address]  [place]  [place] [address]  Fax: [phone]

## 2011-04-08 NOTE — Group Therapy Note (Signed)
   NAME:  ELWIN, TSOU                         ACCOUNT NO.:  [id number]   MEDICAL RECORD NO.:  [id number]                   PATIENT TYPE:  INP   LOCATION:  IC06                                 FACILITY:  [facility]   PHYSICIAN:  Edward L. Juanetta Gosling, M.D.             DATE OF BIRTH:  1956-07-11   DATE OF PROCEDURE:  05/08/2003  DATE OF DISCHARGE:                                   PROGRESS NOTE   PROBLEM:  Respiratory failure.   SUBJECTIVE:  Mr. Sequeira is not speaking because he is intubated and on a  mechanical ventilator.  He is otherwise doing about the same.   OBJECTIVE:  His heart rate is 93, blood pressure 118/79, O2 saturation 100%  on 50% O2.  His chest is actually fairly clear.  He has no other new  problems noticed overnight.  His arterial blood gas on a rate of 14, tidal  volume of 900, and 50% shows pH of 7.515, PCO2 of 28, PO2 of 82.  His BMET  shows sodium 134, electrolytes are otherwise normal, glucose is 133, BUN is  10, creatinine 1.3.  CBC shows hemoglobin is 14.5; white count 10,900;  platelets 185.   ASSESSMENT:  Things are going pretty well.   PLAN:  I am going to switch him to IMV rate of 10 right now.  Will go ahead  and see if he is weanable today and continue with other treatments and  follow.                                               Edward L. Juanetta Gosling, M.D.    ELH/MEDQ  D:  05/08/2003  T:  05/08/2003  Job:  [id number]

## 2011-04-08 NOTE — Consult Note (Signed)
NAME:  Peter Long, Peter Long                         ACCOUNT NO.:  [id number]   MEDICAL RECORD NO.:  [id number]                   PATIENT TYPE:  INP   LOCATION:  IC06                                 FACILITY:  [facility]   PHYSICIAN:  Edward L. Juanetta Gosling, M.D.             DATE OF BIRTH:  1956-02-16   DATE OF CONSULTATION:  DATE OF DISCHARGE:                                   CONSULTATION   HISTORY:  This is a 55 year old who had a sigmoid resection by Dr. Malvin Johns  today.  He was somewhat hypotensive and required Trendelenburg.  The  resection was somewhat complicated and difficult because he was just a  difficult surgery.  He required large amounts of IV fluids to maintain blood  pressure.  He initially was taken off of the respirator but did not tolerate  this and was re-intubated.  He is in the intensive care unit and Dr.  Malvin Johns has requested help with managing the ventilator.   PAST MEDICAL HISTORY:  Positive for diabetes for which he takes Glucovance  5/500.  He has some anxiety for which he takes diazepam.  He has chronic  pain for which he takes hydrocodone.   SOCIAL HISTORY:  He smokes about two packages of cigarettes daily.   FAMILY HISTORY:  Apparently positive for hypertension and diabetes.   REVIEW OF SYSTEMS:  Except as mentioned is essentially negative.  Dr.  Malvin Johns covered a complete review of systems in his History and Physical  and I have reviewed that.   PHYSICAL EXAMINATION:  GENERAL:  Shows that he is intubated, on a  ventilator, and initially was agitated.  He is better after receiving  Diprivan.  VITAL SIGNS:  His blood pressure is running around 102/80.  His O2  saturation is 98% - this on 100% O2.  His pulse is in the 120s and he was  somewhat tachycardic during surgery as well.  HEENT:  His pupils are reactive to light and accomodation.  He has some mild  periorbital edema.  NECK:  Supple without masses.  CHEST:  Shows rhonchi bilaterally.  HEART:  Regular  with a tachycardia.  ABDOMEN:  Soft, obese.  I did not do a complete abdominal examination - he  has had the surgery.  EXTREMITIES:  Showed trace edema.  NEUROLOGIC:  He is on Diprivan so it is difficult to evaluate.  Before the  Diprivan he was moving his extremities.   LABORATORY DATA:  Blood gas done in the recovery room showed pH of 7.25,  PCO2 of 50, PO2 of 91.  He is currently on a ventilator with a CMV rate of  14, tidal volume 900, 5 of PEEP, and 100% O2.  Blood gas will be pending.  Chest x-ray done in the recovery room also shows volume overload.   ASSESSMENT:  He has probably chronic obstructive pulmonary disease;  certainly he has compromised lungs from his smoking.  He has probably volume  overload for which he has received Lasix and we will go ahead and get an  echocardiogram to check his left ventricular function.  Continue with  ventilator support, nebulizers to try to mobilize his secretions, and I am  going to go ahead and put him on a nicotine patch.   Thank you very much for allowing me to see him with you.                                               Edward L. Juanetta Gosling, M.D.    ELH/MEDQ  D:  05/07/2003  T:  05/07/2003  Job:  [id number]

## 2011-04-08 NOTE — H&P (Signed)
NAME:  Peter Long, Peter Long NO.:  [id number]   MEDICAL RECORD NO.:  [id number]                   PATIENT TYPE:  INP   LOCATION:  A302                                 FACILITY:  [facility]   PHYSICIAN:  Barbaraann Barthel, M.D.              DATE OF BIRTH:  1956/02/27   DATE OF ADMISSION:  05/06/2003  DATE OF DISCHARGE:                                HISTORY & PHYSICAL   REASON FOR ADMISSION:  This is a 55 year old white male who is admitted to  [facility] for elective sigmoid resection. He has had at least five  bouts of diverticulitis in the past and we have planned for an elective  sigmoid resection on this patient.  We discussed complications, not limited  to, but including bleeding, infection and anastomotic leaks.  Informed  consent was obtained.   PHYSICAL EXAMINATION:  GENERAL:  Discloses a pleasant 55 year old white  male.  VITAL SIGNS:  He is 6' tall and weighs 293 pounds, blood pressure 136/64,  respirations are 18, heart rate is 79 and temperature is 98.0.  HEENT:  Head is normocephalic.  Eyes:  The extraocular movements are intact.  Pupils equal, round and reactive to light and accommodation.  There is no  conjunctival pallor or scleral injection.  The sclerae are of normal  tincture. The neck is supple and cylindrical without jugular venous  distention, thyromegaly or tracheal deviation.  The patient has no cervical  adenopathy and no bruit was appreciated.  He has had previous cervical disk  surgery.  CHEST:  Clear both to anterior and posterior auscultation.  HEART:  Regular rhythm.  No murmurs or gallops are appreciated.  ABDOMEN:  Soft and the patient is obese.  The bowel sounds are normoactive.  The patient may have a recurrent right lower quadrant hernia and a well  healed surgical incision in the past which is consistent with his previous  laparotomy for an appendectomy and drainage of an abscess and then later two  further  herniorrhaphies.  RECTAL EXAM:  The prostate was smooth.  The stool is guaiac negative.  No  inguinal hernias are appreciated.  EXTREMITIES:  Within normal limits. There is no cyanosis, clubbing or  varicosities.  The patient has had previous abscess drained from his right  medial thigh.   REVIEW OF SYMPTOMS:  GI SYSTEM:  The patient has had recurrent bouts of  diverticulitis requiring hospitalization.  He has had some constipation  occasionally. He has had no bright red rectal bleeding or black tarry  stools.  No past history of hepatitis.  No past history of unexplained  weight loss.  The patient underwent a colonoscopy in 1996 and has had  previous laparoscopic gallbladder surgery.  GU SYSTEM:  No history of  dysuria or hematuria.  No past history of nephrolithiasis.  ENDOCRINE  SYSTEM:  No past history of thyroid disease.  The patient is a  type 2  diabetic for which he takes Glucovance.  He used to take Avandia; he no  longer takes this drug.  CARDIOVASCULAR SYSTEM:  The patient has chronic  bronchitis.  The patient smokes at least two packs of cigarettes per day and  he has had a past history of hypercholesterolemia, but he does not take any  medications for this.  MUSCULOSKELETAL SYSTEM:  The patient has had previous  neck surgery. He is obese and has had some chronic back pain.   SOCIOECONOMIC:  The patient sells mobile homes.  In the past he worked as a  [employer].  He has three children who are grown and he lives with his  wife.   MEDICATIONS:  1. Glucovance 5/500 mg p.o. daily.  2. Diazepam 5 mg p.o. q.h.s. p.r.n. sleep.  3. Hydrocodone 10/650 mg one tablet p.o. q.6h as needed for pain.   LABORATORY DATA:  The patient has a white count of 12.7, a hemoglobin and  hematocrit of 16.7 and 48.8.  Metabolic-7 is within normal limits.  Sodium  135, potassium 3.8, chloride 102, C02 29, glucose 120, BUN 8, creatinine 1.0  and a calcium of 9.1.  The patient has had previous chest  x-rays which show  no active disease but chronic bronchitic changes.  CT scans have been  followed and the last CT scan showed resolving of his diverticulitis.   EKG:  The patient has sinus tachycardia with a rate of 106 on his EKG and  nonspecific lateral T wave abnormalities. No past history of chest pain or  shortness of breath.   IMPRESSION:  This patient has diverticular disease.  Other medical problems  include:  1. Type 2 diabetes mellitus.  2. Obesity.  3. History of hypercholesterolemia.  4. Chronic tobacco abuse.   PLAN:  This patient has had a mechanical prep with [facility] as an  outpatient. We have brought him into the hospital to monitor his blood  sugars and hydration and continue a mechanical and antibiotic prep in plans  for an elective resection in the morning.  We have discussed this in detail  as mentioned above and we will plan for surgery in the a.m.   [name] has seen this patient when he has been admitted in the past and  I will give him the courtesy of a consult medically.                                               Barbaraann Barthel, M.D.    WB/MEDQ  D:  05/06/2003  T:  05/06/2003  Job:  [id number]   cc:   Kirk Ruths, M.D.  [address]  [place]  [place] [address]  Fax: [phone]

## 2011-04-08 NOTE — Consult Note (Signed)
NAME:  Peter Long, Peter Long NO.:  [id number]   MEDICAL RECORD NO.:  [id number]                   PATIENT TYPE:  INP   LOCATION:  A319                                 FACILITY:  [facility]   PHYSICIAN:  Barbaraann Barthel, M.D.              DATE OF BIRTH:  April 22, 1956   DATE OF CONSULTATION:  01/27/2003  DATE OF DISCHARGE:                                   CONSULTATION   REASON FOR CONSULTATION:  Surgery was asked to see this 55 year old white  male who had abdominal pain.   CHIEF COMPLAINT:  Abdominal pain and nausea.   HISTORY OF PRESENT ILLNESS:  The patient states that he has been undergoing  some abdominal pain that has increased in intensity over the last week.  This has particularly gotten worse, with signs of peritoneal irritation  presumptively when he states that he went up and down the stairs his belly  hurt even more.  It was accompanied with some nausea.  He had some  constipation; however, he has just moved his bowels and they were normal  formed stools, although small amount, and has been passing gas.  He came to  see [name] who admitted him through the emergency room.   Of importance is the fact that this patient has known recurrent  diverticulitis.  He was operated on by myself for what was thought to be  appendicitis when he had a loop of sigmoid colon in his right lower quadrant  and a diverticular abscess which was drained at that time along with an  incidental appendectomy.  Since that time, he has had at least two bouts of  acute diverticulitis for which he has been treated as an outpatient with  antibiotics.  He is now admitted for his second admission due to  diverticulitis which is at least his fourth bout of that disease that he can  chronicle.   PHYSICAL EXAMINATION:  GENERAL:  A pleasant obese 55 year old white male who  is known to me in the past.  He is roughly 6 feet tall and weights 293  pounds.  VITAL SIGNS:  Blood  pressure 154/93, heart rate 108, temperature 98.8.  HEENT:  Head is normocephalic.  Eyes:  Extraocular movements are intact.  Pupils are round and reactive to light and accommodation.  There is noted no  conjunctive pallor or scleral injection.  Sclerae is of normal __________.  Nose:  Oral mucosa moist.  NECK:  Supple and cylindrical without jugular venous distension,  thyromegaly, or tracheal deviation.  There is no cervical adenopathy  appreciated and no bruits auscultated.  CHEST:  Clear to both anterior and posterior auscultation.  HEART:  Regular rhythm.  No CV tenderness.  ABDOMEN:  The patient is obese.  Bowel sounds are somewhat diminished.  He  has previous laparoscopic cholecystectomy incisions, and he has an extensive  right lower quadrant incision where he has had  an appendectomy and  laparotomy and drainage of an abscess at that time as well as a repair of a  hernia in this area.  Other surgeries include cervical laminectomy in the  past.  RECTAL:  The prostate is smooth.  The stool is guaiac-negative.  No hernias  are appreciated.  EXTREMITIES:  Within normal limits without clubbing, cyanosis, or  varicosities.  The patient has had previous abscessed drained in his right  medial thigh.   REVIEW OF SYSTEMS:  GI:  History of nausea.  He had no vomiting, although he  states he has had dry heaves.  He has had some constipation recently;  however, no bright red rectal bleeding, no black tarry stools, no history of  unexplained weight loss.  This patient did undergo a colonoscopy  approximately in 1996 as he recalls.  No past history of hepatitis.  Past  history of gallbladder surgery laparoscopically and history of multiple  bouts of diverticulitis.  GU:  No dysuria or hematuria.  No history of  nephrolithiasis.  ENDOCRINE:  No history of thyroid disease.  The patient is  a type 2 diabetic for which he takes Glucovance and Avandia.  CARDIOVASCULAR:  The patient smokes at  least a pack of cigarettes per day.  He has a history of hypercholesterolemia for which he takes some medication,  but he does not recall what it is.  MUSCULOSKELETAL:  The patient has had  previous neck surgery.  The patient is obese and has some chronic neck pain  at times.  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  He has three children who are grown and lives with his wife.   MEDICATIONS:  1. Glucovance.  2. Avandia.  3. Valium at times for his nerves.   ALLERGIES:  HE HAS NO KNOWN ALLERGIES.   LABORATORY DATA:  The patient has a white count of 12.8, H&H 17.5 and 51.5,  74 neutrophils.  His liver function studies and amylase are all grossly  within normal limits.  His CT scan of the abdomen is pending.  His EKG was  done which shows sinus tachycardia and abnormal QRS and T angle considered  primary T wave abnormality.  This will be followed by the medical service.  The patient gives no history of chest pain per se or any pain suggestive of  angina.   IMPRESSION:  1. Recurrent diverticulitis.  2. History of hypercholesterolemia.  3. Type 2 diabetes mellitus.  4. Hypertension.  5. Obesity.   PLAN:  He has been admitted to the medical service.  I will follow him  medically.  I agree with the choice of antibiotics of Cipro and Flagyl at  this time, and we await CT scan results.  I will follow along with you.                                               Barbaraann Barthel, M.D.    Jodi Marble  D:  01/27/2003  T:  01/27/2003  Job:  [id number]   cc:   Peter Long, M.D.  [address]  [place]  [place] [address]  Fax: [phone]

## 2011-04-08 NOTE — H&P (Signed)
NAME:  Peter Long, Peter Long NO.:  [id number]   MEDICAL RECORD NO.:  [id number]                  PATIENT TYPE:   LOCATION:                                       FACILITY:  [facility]   PHYSICIAN:  Kirk Ruths, M.D.            DATE OF BIRTH:   DATE OF ADMISSION:  04/06/2004  DATE OF DISCHARGE:                                HISTORY & PHYSICAL   CHIEF COMPLAINT:  Hurting all over.   PRESENTING ILLNESS:  This is a 55 year old male who has underlying problem  with poorly controlled diabetes and hyperlipidemia.  The patient recently  has been treated with Cleocin and Cipro for multiple abscesses and  hidradenitis.  Patient states the last several days he has been having  sweats and fever, had some tightness in his chest yesterday, he had some  nausea and dry heaving, denies shortness of breath, in the office patient is  obviously sweating, his vital signs were stable although his temperature is  100.2, EKG shows some new changes suggesting ischemia in the anterolateral  leads.  Patient has been notoriously poorly controlled with his blood  sugars.  He was admitted for rule out MI, possible sepsis,  probable  changing to insulin.   PAST MEDICAL HISTORY:  He is allergic to no medications.  Medications  include Valium p.r.n., Glucovance 5/500 b.i.d., gemfibrozil 600 b.i.d.  Patient is status post hemicolectomy for recurrent diverticulitis, he is  status post cholecystectomy, status post cervical disk surgery and  herniorrhaphy repair.   HABITS:  He is a smoker and denies alcohol and substance abuse.   REVIEW OF SYSTEMS:  Noncontributory.   PHYSICAL EXAMINATION:  GENERAL:  Middle-aged obese white male who appears  miserable and is dripping sweat at the time of my exam.  HEENT:  TMs normal.  Pupils equal to light and accommodation.  Oropharynx  benign.  NECK:  Supple without JVD, bruit, or thyromegaly.  LUNGS:  Clear in all areas.  HEART:  Regular sinus  rhythm without murmur, gallop, or rub.  ABDOMEN:  Soft and nontender.  EXTREMITIES:  Without clubbing, cyanosis, or edema.  NEUROLOGIC:  Grossly intact.  SKIN:  Several sebaceous cyst, some old, some fluctuant.     ___________________________________________                                         Kirk Ruths, M.D.   WMM/MEDQ  D:  04/06/2004  T:  04/06/2004  Job:  [id number]

## 2011-04-08 NOTE — Discharge Summary (Signed)
NAME:  Peter Long, Peter Long NO.:  [id number]   MEDICAL RECORD NO.:  [id number]          PATIENT TYPE:  INP   LOCATION:  4711                         FACILITY:  [facility]   PHYSICIAN:  Mark E. Peele, M.D.    DATE OF BIRTH:  1955/11/30   DATE OF ADMISSION:  11/04/2004  DATE OF DISCHARGE:  11/05/2004                                 DISCHARGE SUMMARY   ADMISSION DIAGNOSES:  1.  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  2.  Chronic obstructive pulmonary disease with ongoing tobacco use.  3.  Hypertension.  4.  Adult onset diabetes mellitus, non-insulin dependent.  5.  Hyperlipidemia.  6.  Obesity with a BMI of 34.5.   DISCHARGE DIAGNOSES:  1.  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  2.  Chronic obstructive pulmonary disease with ongoing tobacco use.  3.  Hypertension.  4.  Adult onset diabetes mellitus, non-insulin dependent.  5.  Hyperlipidemia.  6.  Obesity with a BMI of 34.5.   PROCEDURE:  Implantation of an Atlas Plus VR Model:  [id number], Serial [id number],  November 04, 2004.   HISTORY OF PRESENT ILLNESS:  The patient is a 55 year old white male who  presented with an apparent non-ischemic dilated cardiomyopathy approximately  1 year ago. He was well compensated class 2 congestive heart failure. He  complains of frequent palpitations, often occurring in the setting of mental  stress. It is not associated with recent loss of consciousness or pre-  syncope. He had a complete evaluation with a Cardiolite, which demonstrated  inferior scar with prior diaphragmatic infarction and ejection fraction of  approximately 15%. Echocardiogram demonstrated dilated left ventricular with  poor systolic function. Ejection fraction was 20% with mild to moderate  concentric left ventricular hypertrophy. There was no severe regurgitant  valvular lesions.  Cardiac catheterization done last year demonstrated non-  critical coronary artery disease and ejection fraction was 30% also. The  patient was referred to Dr. Severiano Gilbert for his dilated cardiomyopathy and it was  his opinion that the patient was a candidate for ICD placement. The risks  and benefits were discussed and he was admitted for elective ICD placement.  He has a history of methicillin resistant staph aureus and was treated with  Vancomycin in the past. He is currently free of any known infection.   PAST MEDICAL HISTORY:  Includes noninsulin-dependent diabetes, hypertension,  dyslipidemia, dilated cardiomyopathy historically non-ischemic although  Cardiolite supports probable diagnosis of inferior infarction secondary to  ischemic event. Class 2 congestive heart failure.   SOCIAL HISTORY:  He continues to smoke. Denies alcohol. He has been married  and has 3 children. He works.   ADMISSION MEDICATIONS:  Altace 5 mg daily, Gemfibrozil 600 mg b.i.d., Coreg  25 mg b.i.d., Glucovance 5/500 b.i.d., Digoxin 0.125 mg daily, Inspra 25 mg  daily and p.r.n. insulin.   HISTORY AND PHYSICAL:  For further history and physical, please see dictated  note.   HOSPITAL COURSE:  The  patient was admitted. Underwent placement of new ICD  by Dr. Severiano Gilbert. He tolerated the procedure well. He had some pain  postoperatively but overall, did well. He was transferred to the floor.  Following a.m., he was doing well. The device was interrogated and after  reviewing the x-ray, it was Dr. Aggie Hacker opinion that he could be discharged  home. He went home on his pre-admission medications. This included the  following:   DISCHARGE MEDICATIONS:  1.  Altace daily 5 mg.  2.  Gemfibrozil 600 mg b.i.d.  3.  Coreg 25 mg b.i.d.  4.  Glucovance 5/500 b.i.d. a.c. He is to start that tomorrow.  5.  Digoxin 0.125 mg daily.  6.  Inspra 25 mg daily.  7.  Insulin as before.  8.  For pain, he was given Ultram 50 mg 1 to 2  p.o. q. 4 hours p.r.n.  9.  Tylenol 500 mg 2 q. 4 hours p.r.n.  The patient did ask for something stronger. Noted, he has been on Oxycodone  for a number of years for diverticular pain.   FOLLOW UP:  He will return in 1 week to have his wound checked, 2 weeks to  see Dr. Domingo Sep in [place] and in 6 weeks to see Dr. Severiano Gilbert for post-  procedure threshold evaluation.   ACTIVITY:  He was instructed to maintain light activity. No lifting of his  arm above his head for 7 days.   DIET:  Low-fat diabetic diet.   CONDITION ON DISCHARGE:  Improved.       WDJ/MEDQ  D:  11/05/2004  T:  11/07/2004  Job:  [id number]   cc:   Dani Gobble, MD  Fax: [phone]

## 2011-04-08 NOTE — Group Therapy Note (Signed)
   NAME:  Peter, Long                         ACCOUNT NO.:  [id number]   MEDICAL RECORD NO.:  [id number]                   PATIENT TYPE:  INP   LOCATION:  IC06                                 FACILITY:  [facility]   PHYSICIAN:  Edward L. Juanetta Gosling, M.D.             DATE OF BIRTH:  [birth year]   DATE OF PROCEDURE:  05/09/2003  DATE OF DISCHARGE:                                   PROGRESS NOTE   PROBLEM:  Respiratory failure.   SUBJECTIVE:  Peter Long is about the same.  He remains on the mechanical  ventilator.  He has had a fairly marked volume overload and had what  appeared to be a pulmonary edema by chest x-ray yesterday.  This morning he  has not had his portable chest as yet, but there was apparently some problem  with the portable x-ray machine at this point.   OBJECTIVE:  Otherwise, he is sedated.  His chest still shows some bilateral  wheezes.  His heart is regular, rate is about 96.  Blood pressure 129/81, O2  saturation 99% on the ventilator.  His blood gas is pending.  White count  9600, hemoglobin 13.4.   ASSESSMENT AND PLAN:  I think we need to go ahead and try to push a little  harder with the diuresis; he is only down about 2 pounds from yesterday and  he needs to be dry when we try to extubate him again.  Portable chest x-ray  is pending.  We will get some blood gasses, repeat all his labs in the  morning, and continue otherwise.                                               Edward L. Juanetta Gosling, M.D.    ELH/MEDQ  D:  05/09/2003  T:  05/09/2003  Job:  [id number]

## 2011-04-08 NOTE — Group Therapy Note (Signed)
   NAME:  Peter Long, Peter Long                         ACCOUNT NO.:  [id number]   MEDICAL RECORD NO.:  [id number]                   PATIENT TYPE:  INP   LOCATION:  IC06                                 FACILITY:  [facility]   PHYSICIAN:  Edward L. Juanetta Gosling, M.D.             DATE OF BIRTH:  [birth year]   DATE OF PROCEDURE:  DATE OF DISCHARGE:                                   PROGRESS NOTE   SUBJECTIVE:  Peter Long is intubated and on a ventilator but has had no new  problems noted by the nursing staff.  Things seem to be going fairly well.   OBJECTIVE:  His exam today shows that he is comfortable.  He is on Diprivan.  His chest is clearer than it has been.  Chest x-ray is pending.  His BMET  shows sodium 139, potassium 3.7, chloride 101, CO2 31, glucose 127, BUN of  6, creatinine 1.1, and calcium 8.3.  His blood gas on 50% O2, 900 titer  volume, rate of 6, shows pH 7.39, pCO2 of 47, pO2 of 116.   ASSESSMENT:  Things generally seem to be going fairly well.  A 24-hour I&O -  730.  His weight, I believe, is inaccurate today.  He is still maintained on  mechanical ventilation.   PLAN:  Dr. Malvin Johns wants him to remain on mechanical ventilation a bit  longer simply to avoid a cough and possible eventration problems.  Will  check on chest x-ray.  Leave him on current settings.  Increase his Lasix  since he has not had a strikingly negative I&O and perhaps work on trying to  get him off tomorrow.                                               Edward L. Juanetta Gosling, M.D.    ELH/MEDQ  D:  05/10/2003  T:  05/11/2003  Job:  [id number]

## 2011-04-08 NOTE — Op Note (Signed)
   NAME:  Peter Long, Peter Long                         ACCOUNT NO.:  [id number]   MEDICAL RECORD NO.:  [id number]                   PATIENT TYPE:  AMB   LOCATION:  DAY                                  FACILITY:  [facility]   PHYSICIAN:  Barbaraann Barthel, M.D.              DATE OF BIRTH:  Feb 11, 1956   DATE OF PROCEDURE:  DATE OF DISCHARGE:                                 OPERATIVE REPORT   PREOPERATIVE DIAGNOSIS:  Right gluteal abscess, complex.   POSTOPERATIVE DIAGNOSIS:  Right gluteal abscess, complex.   PROCEDURE:  1. Excision and packing of right gluteal abscess.  2. Rigid proctoscopy to 25 cm.   SURGEON:  Barbaraann Barthel, M.D.   SPECIMENS:  1. Abscess and necrotic right gluteal material.  2. Cultures for sensitivities.   NOTE:  This is a 55 year old white male, type 2 diabetic who presented with  pain and drainage from his right gluteal area.  He has no obvious pilonidal  cyst.  He does have a history of diabetes mellitus type 2.  He incidentally  had undergone a low anterior resection with primary anastomosis for  diverticular disease over a month ago.   GROSS OPERATIVE FINDINGS:  Abscess and necrotic tissue in the skin,  subcutaneous tissue, and muscle of the gluteus on the right side.   TECHNIQUE:  The patient was placed in the prone position after the adequate  administration of spinal anesthesia.  Digital examination was performed and  a rigid proctoscopy was performed to 25 cm.  There were no abnormalities  encountered and no communication with the gluteal problem.   The buttocks were then prepped with Betadine solution and draped in the  usual manner.  An elliptical incision was carried out around the abscess  removing all necrotic tissue and obtaining cultures for sensitivities.   The wound was then irrigated with normal saline solution and packed open  with a 2-inch Kerlix gauze soaked in saline. A sterile dressing was applied.  The patient will be admitted for  observation.  We will plan for wound care  in conjunction with the physical therapy department and my office.  The  patient has been on p.o. antibiotics.  We will initiate the parenteral  antibiotics while he is in the hospital.     WB/MEDQ  D:  08/04/2003  T:  08/04/2003  Job:  [id number]

## 2011-04-08 NOTE — Discharge Summary (Signed)
   NAME:  Peter Long, SPRONG NO.:  [id number]   MEDICAL RECORD NO.:  [id number]                   PATIENT TYPE:  INP   LOCATION:  A319                                 FACILITY:  [facility]   PHYSICIAN:  Kirk Ruths, M.D.            DATE OF BIRTH:  02-Jan-1956   DATE OF ADMISSION:  01/27/2003  DATE OF DISCHARGE:  01/30/2003                                 DISCHARGE SUMMARY   FINAL DIAGNOSES:  1. Recurrent diverticulitis without abscess.  2. Type 2 diabetes.  3. Hypertension.  4. Hyperlipidemia.   HOSPITAL COURSE:  This 55 year old white male presented to the office with a  1-week history of progressive lower abdominal pain, associated with fever,  chills, and constipation.  The patient had previous episodes of  diverticulitis.  The patient's temperature was 100 at the time of admission.  His abdomen was acutely tender, suspicious for acute abdomen.  He was  admitted to the floor.  CT scan was obtained.  It showed acute  diverticulitis without abscess.  The patient's white count was 12.8 on  admission, hemoglobin 17.5.  Electrolytes were normal as well as urinalysis.  The patient's liver function tests were also normal as well as amylase.   He was seen in consultation by Dr. Malvin Johns for surgery, who basically  agreed with the findings.  IV antibiotics consisted of Cipro and Flagyl.  The patient gradually improved.  His blood pressure defervescing.  Blood  pressure fairly well controlled through the stay, as well as his blood  sugar.  The patient tolerating a regular diet without fever.   DISPOSITION:  The patient was discharged home on Flagyl and Cipro as well as  his previous medications.  He is to follow up with Dr. Malvin Johns at a later  date for possible re-colectomy.  He also was placed on Metamucil and Colace.   CONDITION:  Stable at the time of discharge.                                               Kirk Ruths, M.D.    WMM/MEDQ  D:   02/20/2003  T:  02/21/2003  Job:  [id number]

## 2011-04-08 NOTE — Group Therapy Note (Signed)
   NAME:  Peter Long, Peter Long                         ACCOUNT NO.:  [id number]   MEDICAL RECORD NO.:  [id number]                   PATIENT TYPE:  INP   LOCATION:  IC06                                 FACILITY:  [facility]   PHYSICIAN:  Edward L. Juanetta Gosling, M.D.             DATE OF BIRTH:  09-20-1956   DATE OF PROCEDURE:  05/11/2003  DATE OF DISCHARGE:                                   PROGRESS NOTE   SUBJECTIVE:  Peter Long has done well overnight and has no new problems noted  by the nursing staff.  He is comfortable and I have switched him now to a  rate of 0 with pressure support and he is tolerating this very well thus  far.   OBJECTIVE:  His exam shows that his chest is quite clear, his heart is  regular, he is -1800 for the 24 hours yesterday.  His BMP shows that his  glucose is 129 but BUN is 10, creatinine 1.2.  Blood gas this morning shows  a PO2 of 113, pH 7.38, PCO2 of 49 on a rate of 6.   ASSESSMENT:  He is doing very well.   PLAN:  He is going to remain on the pressure support and the T bar.  Will  check weaning parameters, check blood gases, and if they are adequate will  plan to extubate later today.  I have reviewed his chest x-ray which about  the same as yesterday but I do not think he took quite as deep a breath.                                               Edward L. Juanetta Gosling, M.D.    ELH/MEDQ  D:  05/11/2003  T:  05/12/2003  Job:  [id number]

## 2011-04-08 NOTE — Op Note (Signed)
NAME:  Peter Long, Peter Long NO.:  [id number]   MEDICAL RECORD NO.:  [id number]                  PATIENT TYPE:   LOCATION:                                       FACILITY:   PHYSICIAN:  Barbaraann Barthel, M.D.              DATE OF BIRTH:   DATE OF PROCEDURE:  04/07/2004  DATE OF DISCHARGE:                                 OPERATIVE REPORT   PREOPERATIVE DIAGNOSIS:  Right gluteal abscess.   POSTOPERATIVE DIAGNOSIS:  Right gluteal abscess.   PROCEDURE:  Irrigation and debridement of right gluteal abscess with  debridement, packing, and irrigation.   SURGEON:  Barbaraann Barthel, M.D.   SPECIMENS:  Cultures obtained from gluteal abscess.   NOTE:  This is a 55 year old, white male, obese diabetic who has had  previous problems with folliculitis and perianal abscesses in the past.  He  was admitted to the medical service for diabetic control and for fever.  He  was noted to have an abscess in his gluteal area.  Surgery was consulted,  responded immediately, and we planned for an I&D.  This patient is well-  known to me in the past and we discussed preoperatively and obtained  consent. Complications included bleeding, infection, and the possibility of  recurrence.  Informed consent was obtained.   TECHNIQUE:  The patient was placed in the prone position after separating  the buttock with tape.  His gluteal area was prepped with Betadine solution  and draped in the usual manner.  Using approximately 10 cc of 1% Xylocaine  without epinephrine the area was anesthetized and the abscess was opened  widely and drained and cultures were obtained.  The areas of necrotic tissue  were debrided and after irrigation and controlling the bleeding with the  cautery device the wound was then packed open with Kling soaked normal  saline gauze with an ABD pad applied.  Prior to closure all sponge, needle,  and instrument counts were found to be correct.  Estimated blood loss  minimal.  There were no complications.   I will be out of town tomorrow.  Dr. Katrinka Blazing will be covering for me in my  absence tomorrow and the urology service will be covering for me until  Monday.  I have made arrangements for outpatient PT Department irrigations  and packing when he is discharged from the hospital and wrote postoperative  orders for the same b.i.d. while he is in hospital.  I will follow up with  him on Monday when I return.      ___________________________________________                                            Barbaraann Barthel, M.D.   WB/MEDQ  D:  04/07/2004  T:  04/07/2004  Job:  [id number]   cc:  Barbaraann Barthel, M.D.  [address]  [place]  [place] [address]  Fax: [phone]   Kirk Ruths, M.D.  [address]  [place]  [place] [address]  Fax: [phone]   Dirk Dress. Katrinka Blazing, M.D.  [address]  [place]  [place] [address]  Fax: [phone]   Ky Barban, M.D.  [address]  [place]  [place] [address]  Fax: [phone]

## 2011-05-04 ENCOUNTER — Other Ambulatory Visit: Payer: Self-pay | Admitting: Cardiology

## 2011-06-01 ENCOUNTER — Other Ambulatory Visit: Payer: Self-pay | Admitting: Cardiology

## 2011-06-29 ENCOUNTER — Other Ambulatory Visit: Payer: Self-pay | Admitting: Cardiology

## 2011-07-13 ENCOUNTER — Other Ambulatory Visit: Payer: Self-pay | Admitting: Cardiology

## 2011-07-27 ENCOUNTER — Other Ambulatory Visit: Payer: Self-pay | Admitting: Cardiology

## 2011-07-27 NOTE — Telephone Encounter (Signed)
Call pt to have to schedule a ov with Dr. Diona Browner. No answer, will try back later.

## 2011-11-16 ENCOUNTER — Encounter: Payer: Self-pay | Admitting: Cardiology

## 2012-01-27 ENCOUNTER — Other Ambulatory Visit (HOSPITAL_COMMUNITY): Payer: Self-pay | Admitting: Family Medicine

## 2012-01-27 DIAGNOSIS — M779 Enthesopathy, unspecified: Secondary | ICD-10-CM

## 2012-01-30 ENCOUNTER — Ambulatory Visit (INDEPENDENT_AMBULATORY_CARE_PROVIDER_SITE_OTHER): Payer: MEDICARE | Admitting: Cardiology

## 2012-01-30 ENCOUNTER — Ambulatory Visit (HOSPITAL_COMMUNITY)
Admission: RE | Admit: 2012-01-30 | Discharge: 2012-01-30 | Disposition: A | Discharge status: Home / Self Care | Payer: Medicare Other | Source: Ambulatory Visit | Attending: Family Medicine | Admitting: Family Medicine

## 2012-01-30 ENCOUNTER — Encounter: Payer: Self-pay | Admitting: Cardiology

## 2012-01-30 VITALS — BP 170/96 | HR 71 | Resp 18 | Ht 72.0 in | Wt 242.0 lb

## 2012-01-30 DIAGNOSIS — M779 Enthesopathy, unspecified: Secondary | ICD-10-CM

## 2012-01-30 DIAGNOSIS — E782 Mixed hyperlipidemia: Secondary | ICD-10-CM

## 2012-01-30 DIAGNOSIS — M67919 Unspecified disorder of synovium and tendon, unspecified shoulder: Secondary | ICD-10-CM | POA: Insufficient documentation

## 2012-01-30 DIAGNOSIS — I429 Cardiomyopathy, unspecified: Secondary | ICD-10-CM

## 2012-01-30 DIAGNOSIS — M719 Bursopathy, unspecified: Secondary | ICD-10-CM | POA: Insufficient documentation

## 2012-01-30 DIAGNOSIS — Z01818 Encounter for other preprocedural examination: Secondary | ICD-10-CM

## 2012-01-30 DIAGNOSIS — I10 Essential (primary) hypertension: Secondary | ICD-10-CM

## 2012-01-30 DIAGNOSIS — Z95810 Presence of automatic (implantable) cardiac defibrillator: Secondary | ICD-10-CM

## 2012-01-30 DIAGNOSIS — M25519 Pain in unspecified shoulder: Secondary | ICD-10-CM | POA: Insufficient documentation

## 2012-01-30 MED ORDER — FUROSEMIDE 20 MG PO TABS: ORAL_TABLET | ORAL | Status: DC

## 2012-01-30 MED ORDER — IOHEXOL 300 MG/ML  SOLN
12.0000 mL | Freq: Once | INTRAMUSCULAR | Status: AC | PRN
  Administered 2012-01-30: 12 mL via INTRA_ARTICULAR

## 2012-01-30 NOTE — Progress Notes (Signed)
Clinical Summary Peter Long is a 56 y.o.male referred back to the office by [name] for followup and possible preoperative consultation. He was last seen in January 2012.  He is here with his wife. He states that he is undergoing workup for right shoulder pain with evidence of torn rotator cuff. He has prior history of left shoulder surgery.  He also states that he recently touched an electric fence by accident and was concerned about his ICD. He reports no device shocks otherwise. I do not find any recent ICD interrogation.  Lexiscan Myoview from May 2011 showed no diagnostic ST segment changes. Images were affected by artifact, however no obvious large areas of ischemia were identified. Echocardiogram at that time demonstrated an LVEF of 30-35% with diffuse hypokinesis.  ECG is reviewed below. No denies any progressive dyspnea or chest pain. Medications were reviewed.  Allergies  Allergen Reactions  . Morphine     Current Outpatient Prescriptions  Medication Sig Dispense Refill  . buPROPion (WELLBUTRIN SR) 150 MG 12 hr tablet Take 150 mg by mouth 2 (two) times daily.        . carvedilol (COREG) 25 MG tablet Take 25 mg by mouth 2 (two) times daily with a meal.        . citalopram (CELEXA) 40 MG tablet Take 40 mg by mouth daily.        Marland Kitchen docusate sodium (COLACE) 100 MG capsule Take 100 mg by mouth 2 (two) times daily.      Marland Kitchen escitalopram (LEXAPRO) 10 MG tablet Take 10 mg by mouth daily.      . insulin glargine (LANTUS) 100 UNIT/ML injection Inject 80 Units into the skin at bedtime.        Marland Kitchen lisinopril (PRINIVIL,ZESTRIL) 10 MG tablet Take 10 mg by mouth daily.        . meloxicam (MOBIC) 7.5 MG tablet Take 15 mg by mouth daily.      . mirtazapine (REMERON) 15 MG tablet Take 15 mg by mouth at bedtime.        . naproxen sodium (ANAPROX) 220 MG tablet Take 220 mg by mouth 2 (two) times daily with a meal.        . oxyCODONE (OXYCONTIN) 20 MG 12 hr tablet Take 20 mg by mouth every 12  (twelve) hours.      . simvastatin (ZOCOR) 40 MG tablet Take 40 mg by mouth daily.        . furosemide (LASIX) 20 MG tablet Take 1 to 2 tablets daily PRN for edema  60 tablet  6   No current facility-administered medications for this visit.   Facility-Administered Medications Ordered in Other Visits  Medication Dose Route Frequency Provider Last Rate Last Dose  . iohexol (OMNIPAQUE) 300 MG/ML solution 12 mL  12 mL Intra-articular Once PRN Consuello Bossier, MD   12 mL at 01/30/12 1200    Past Medical History  Diagnosis Date  . Type 2 diabetes mellitus   . Hyperlipidemia   . Essential hypertension, benign   . Coronary artery disease     Possible - poorly documented  . Chronic pain   . Diverticulitis   . COPD (chronic obstructive pulmonary disease)   . Anxiety   . Depression   . Cerebrovascular disease   . Nonischemic cardiomyopathy     LVEF 30-35% 5/11  . MRSA (methicillin resistant Staphylococcus aureus) septicemia     Past Surgical History  Procedure Date  . Right gluteal abscess drainage  2005  . Abdominal surgery 2004  . Cardiac defibrillator placement 2005    [facility] - Dr. Severiano Gilbert, [employer] [facility] VR  . Ercp with stone extraction 2010  . Arthroscopic left shoulder surgery 2011    Family History  Problem Relation Age of Onset  . Heart disease    . Cancer    . Diabetes      Social History Peter Long reports that he has been smoking Cigarettes.  He has never used smokeless tobacco. Peter Long reports that he does not drink alcohol.  Review of Systems Residual left sided deficits from prior stroke. No palpitations. Stable appetite. No orthopnea. Weight up over last few years. No regular exercise. Limited shoulder range of motion bilaterally. Otherwise negative.  Physical Examination Filed Vitals:   01/30/12 1309  BP: 170/96  Pulse: 71  Resp: 18   Obese, chronically ill-appearing disheveled male, in no acute distress.  HEENT: Conjunctiva and lids normal, oropharynx  with poor dentition.  Neck: Supple, no elevated JVP or loud carotid bruits.  Lungs: Diminished breath sounds, non-labored, no wheezing.  Cardiac: Regular rate and rhythm, indistinct PMI, no S3 gallop or loud systolic murmur.  Thorax: Well-healed device pocket site on the right.  Abdomen: Obese, unable to palpate liver edge, bowel sounds present, no tenderness.  Extremities: 1+ edema left worse than right, distal pulses one plus, some venous stasis.  Neuropsychiatric: Alert and oriented x3, affect somewhat guarded. Muscle strength 3-4/5 left versus normal on the right.   ECG Sinus rhythm with vertical axis.   Problem List and Plan

## 2012-01-30 NOTE — Assessment & Plan Note (Signed)
At this point no surgery is planned based on patient report. He may need right shoulder surgery, full evaluation pending. He remains stable symptomatically with chronic illness, no reported chest pain or progressive dyspnea. Had ischemic testing within the last 2 years. ECG reviewed. Will await formal communication from surgeon, if in fact surgery is needed - patient may not require further cardiac testing.

## 2012-01-30 NOTE — Assessment & Plan Note (Signed)
Need to arrange followup device interrogation. See above HPI.

## 2012-01-30 NOTE — Assessment & Plan Note (Signed)
Blood pressure not well controlled today. We discussed his medications. Should keep eye on this with [name]. Lisinopril could be further advanced.

## 2012-01-30 NOTE — Patient Instructions (Signed)
**Note De-Identified Peter Long Obfuscation** Your physician has recommended you make the following change in your medication: start taking Lasix 20 mg (take 1 to 2 tablets daily as needed for swelling  Your physician recommends that you schedule a follow-up appointment in: 6 months

## 2012-01-30 NOTE — Assessment & Plan Note (Signed)
He continues on statin, followed by [name]. Goal LDL under 100.

## 2012-01-30 NOTE — Procedures (Signed)
ARTHROGRAM OF THE RIGHTSHOULDER  Technique: Using sterile technique and local anesthesia I inserted a 22 gauge spinal needle into the right shoulder joint and injected contrast under direct fluoroscopic visualization.  Fluoroscopy Time: 1.5 minutes.  Contrast: 10 ml Omnipaque-300 intra-articular  Complications: None.

## 2012-01-30 NOTE — Assessment & Plan Note (Signed)
LVEF 30-35% by echocardiogram 2011. On beta blocker and ACE-I. Adding Lasix back to regimen as well.

## 2012-02-03 ENCOUNTER — Ambulatory Visit (INDEPENDENT_AMBULATORY_CARE_PROVIDER_SITE_OTHER): Payer: MEDICARE | Admitting: *Deleted

## 2012-02-03 ENCOUNTER — Encounter: Payer: Self-pay | Admitting: Internal Medicine

## 2012-02-03 DIAGNOSIS — I429 Cardiomyopathy, unspecified: Secondary | ICD-10-CM

## 2012-02-03 LAB — ICD DEVICE OBSERVATION
BATTERY VOLTAGE: 2.57 V
DEVICE MODEL ICD: 255342
RV LEAD THRESHOLD: 0.75 V
TOT-0007: 2
TOT-0008: 0
TOT-0009: 1
VENTRICULAR PACING ICD: 1 pct

## 2012-02-03 NOTE — Progress Notes (Signed)
ICD check 

## 2012-03-14 ENCOUNTER — Other Ambulatory Visit (HOSPITAL_COMMUNITY): Payer: Self-pay | Admitting: Physician Assistant

## 2012-03-14 DIAGNOSIS — K429 Umbilical hernia without obstruction or gangrene: Secondary | ICD-10-CM

## 2012-03-14 DIAGNOSIS — G8929 Other chronic pain: Secondary | ICD-10-CM

## 2012-03-16 ENCOUNTER — Ambulatory Visit (HOSPITAL_COMMUNITY)
Admission: RE | Admit: 2012-03-16 | Discharge: 2012-03-16 | Disposition: A | Discharge status: Home / Self Care | Payer: Medicare Other | Source: Ambulatory Visit | Attending: Physician Assistant | Admitting: Physician Assistant

## 2012-03-16 ENCOUNTER — Encounter (HOSPITAL_COMMUNITY): Payer: Self-pay

## 2012-03-16 DIAGNOSIS — K439 Ventral hernia without obstruction or gangrene: Secondary | ICD-10-CM | POA: Insufficient documentation

## 2012-03-16 DIAGNOSIS — R109 Unspecified abdominal pain: Secondary | ICD-10-CM | POA: Insufficient documentation

## 2012-03-16 DIAGNOSIS — K429 Umbilical hernia without obstruction or gangrene: Secondary | ICD-10-CM

## 2012-03-16 DIAGNOSIS — G8929 Other chronic pain: Secondary | ICD-10-CM

## 2012-03-26 ENCOUNTER — Inpatient Hospital Stay (HOSPITAL_COMMUNITY)
Admission: EM | Admit: 2012-03-26 | Discharge: 2012-03-28 | DRG: 065 | Disposition: A | Discharge status: Home Health | Payer: Medicare Other | Attending: Internal Medicine | Admitting: Internal Medicine

## 2012-03-26 ENCOUNTER — Emergency Department (HOSPITAL_COMMUNITY): Payer: Medicare Other

## 2012-03-26 ENCOUNTER — Encounter (HOSPITAL_COMMUNITY): Payer: Self-pay | Admitting: Emergency Medicine

## 2012-03-26 DIAGNOSIS — Z01818 Encounter for other preprocedural examination: Secondary | ICD-10-CM

## 2012-03-26 DIAGNOSIS — F419 Anxiety disorder, unspecified: Secondary | ICD-10-CM | POA: Diagnosis present

## 2012-03-26 DIAGNOSIS — R233 Spontaneous ecchymoses: Secondary | ICD-10-CM | POA: Diagnosis present

## 2012-03-26 DIAGNOSIS — F3289 Other specified depressive episodes: Secondary | ICD-10-CM | POA: Diagnosis present

## 2012-03-26 DIAGNOSIS — Z9119 Patient's noncompliance with other medical treatment and regimen: Secondary | ICD-10-CM

## 2012-03-26 DIAGNOSIS — I6350 Cerebral infarction due to unspecified occlusion or stenosis of unspecified cerebral artery: Principal | ICD-10-CM | POA: Diagnosis present

## 2012-03-26 DIAGNOSIS — I69959 Hemiplegia and hemiparesis following unspecified cerebrovascular disease affecting unspecified side: Secondary | ICD-10-CM

## 2012-03-26 DIAGNOSIS — I6330 Cerebral infarction due to thrombosis of unspecified cerebral artery: Secondary | ICD-10-CM

## 2012-03-26 DIAGNOSIS — E119 Type 2 diabetes mellitus without complications: Secondary | ICD-10-CM | POA: Diagnosis present

## 2012-03-26 DIAGNOSIS — E1142 Type 2 diabetes mellitus with diabetic polyneuropathy: Secondary | ICD-10-CM | POA: Diagnosis present

## 2012-03-26 DIAGNOSIS — F1220 Cannabis dependence, uncomplicated: Secondary | ICD-10-CM | POA: Diagnosis present

## 2012-03-26 DIAGNOSIS — I639 Cerebral infarction, unspecified: Secondary | ICD-10-CM | POA: Diagnosis present

## 2012-03-26 DIAGNOSIS — E782 Mixed hyperlipidemia: Secondary | ICD-10-CM | POA: Diagnosis present

## 2012-03-26 DIAGNOSIS — M25819 Other specified joint disorders, unspecified shoulder: Secondary | ICD-10-CM

## 2012-03-26 DIAGNOSIS — G8929 Other chronic pain: Secondary | ICD-10-CM | POA: Diagnosis present

## 2012-03-26 DIAGNOSIS — I429 Cardiomyopathy, unspecified: Secondary | ICD-10-CM | POA: Diagnosis present

## 2012-03-26 DIAGNOSIS — Z9181 History of falling: Secondary | ICD-10-CM

## 2012-03-26 DIAGNOSIS — F17200 Nicotine dependence, unspecified, uncomplicated: Secondary | ICD-10-CM | POA: Diagnosis present

## 2012-03-26 DIAGNOSIS — F329 Major depressive disorder, single episode, unspecified: Secondary | ICD-10-CM | POA: Diagnosis present

## 2012-03-26 DIAGNOSIS — E1049 Type 1 diabetes mellitus with other diabetic neurological complication: Secondary | ICD-10-CM

## 2012-03-26 DIAGNOSIS — Z95810 Presence of automatic (implantable) cardiac defibrillator: Secondary | ICD-10-CM | POA: Diagnosis present

## 2012-03-26 DIAGNOSIS — F32A Depression, unspecified: Secondary | ICD-10-CM | POA: Diagnosis present

## 2012-03-26 DIAGNOSIS — Z91199 Patient's noncompliance with other medical treatment and regimen due to unspecified reason: Secondary | ICD-10-CM

## 2012-03-26 DIAGNOSIS — E1149 Type 2 diabetes mellitus with other diabetic neurological complication: Secondary | ICD-10-CM | POA: Diagnosis present

## 2012-03-26 DIAGNOSIS — I2510 Atherosclerotic heart disease of native coronary artery without angina pectoris: Secondary | ICD-10-CM | POA: Diagnosis present

## 2012-03-26 DIAGNOSIS — Z720 Tobacco use: Secondary | ICD-10-CM | POA: Diagnosis present

## 2012-03-26 DIAGNOSIS — Z794 Long term (current) use of insulin: Secondary | ICD-10-CM

## 2012-03-26 DIAGNOSIS — F411 Generalized anxiety disorder: Secondary | ICD-10-CM | POA: Diagnosis present

## 2012-03-26 DIAGNOSIS — E669 Obesity, unspecified: Secondary | ICD-10-CM | POA: Diagnosis present

## 2012-03-26 DIAGNOSIS — I10 Essential (primary) hypertension: Secondary | ICD-10-CM | POA: Diagnosis present

## 2012-03-26 DIAGNOSIS — Z6833 Body mass index (BMI) 33.0-33.9, adult: Secondary | ICD-10-CM

## 2012-03-26 DIAGNOSIS — M25519 Pain in unspecified shoulder: Secondary | ICD-10-CM

## 2012-03-26 LAB — CARDIAC PANEL(CRET KIN+CKTOT+MB+TROPI)
Relative Index: 1 (ref 0.0–2.5)
Total CK: 759 U/L — ABNORMAL HIGH (ref 7–232)

## 2012-03-26 LAB — BASIC METABOLIC PANEL
CO2: 29 mEq/L (ref 19–32)
Calcium: 9.4 mg/dL (ref 8.4–10.5)
Creatinine, Ser: 0.99 mg/dL (ref 0.50–1.35)
GFR calc non Af Amer: 90 mL/min — ABNORMAL LOW (ref >90)
Glucose, Bld: 185 mg/dL — ABNORMAL HIGH (ref 70–99)
Sodium: 136 mEq/L (ref 135–145)

## 2012-03-26 LAB — CBC
HCT: 45.8 % (ref 39.0–52.0)
MCH: 30.5 pg (ref 26.0–34.0)
MCV: 87.7 fL (ref 78.0–100.0)
Platelets: 155 10*3/uL (ref 150–400)
RDW: 13.1 % (ref 11.5–15.5)

## 2012-03-26 LAB — URINALYSIS, ROUTINE W REFLEX MICROSCOPIC
Glucose, UA: 250 mg/dL — AB
Ketones, ur: NEGATIVE mg/dL
Leukocytes, UA: NEGATIVE
Nitrite: NEGATIVE
Specific Gravity, Urine: 1.02 (ref 1.005–1.030)
pH: 6 (ref 5.0–8.0)

## 2012-03-26 LAB — RAPID URINE DRUG SCREEN, HOSP PERFORMED
Amphetamines: NOT DETECTED
Benzodiazepines: POSITIVE — AB
Cocaine: NOT DETECTED

## 2012-03-26 LAB — DIFFERENTIAL
Basophils Absolute: 0 10*3/uL (ref 0.0–0.1)
Eosinophils Absolute: 0.3 10*3/uL (ref 0.0–0.7)
Eosinophils Relative: 2 % (ref 0–5)
Monocytes Absolute: 1.4 10*3/uL — ABNORMAL HIGH (ref 0.1–1.0)

## 2012-03-26 LAB — LIPASE, BLOOD: Lipase: 26 U/L (ref 11–59)

## 2012-03-26 LAB — GLUCOSE, CAPILLARY: Glucose-Capillary: 177 mg/dL — ABNORMAL HIGH (ref 70–99)

## 2012-03-26 MED ORDER — BUPROPION HCL ER (SR) 150 MG PO TB12
150.0000 mg | ORAL_TABLET | Freq: Two times a day (BID) | ORAL | Status: DC
  Administered 2012-03-27: 150 mg via ORAL
  Filled 2012-03-26 (×4): qty 1

## 2012-03-26 MED ORDER — NICOTINE 21 MG/24HR TD PT24
21.0000 mg | MEDICATED_PATCH | Freq: Every day | TRANSDERMAL | Status: DC
  Administered 2012-03-27 (×2): 21 mg via TRANSDERMAL
  Filled 2012-03-26 (×3): qty 1

## 2012-03-26 MED ORDER — ACETAMINOPHEN 650 MG RE SUPP: 650.0000 mg | RECTAL | Status: DC | PRN

## 2012-03-26 MED ORDER — BUPROPION HCL ER (SR) 150 MG PO TB12
ORAL_TABLET | ORAL | Status: AC
  Filled 2012-03-26: qty 1

## 2012-03-26 MED ORDER — CITALOPRAM HYDROBROMIDE 20 MG PO TABS
20.0000 mg | ORAL_TABLET | Freq: Every evening | ORAL | Status: DC
Start: 2012-03-27 — End: 2012-03-28
  Administered 2012-03-27: 20 mg via ORAL
  Filled 2012-03-26: qty 1

## 2012-03-26 MED ORDER — INSULIN ASPART 100 UNIT/ML ~~LOC~~ SOLN
0.0000 [IU] | Freq: Three times a day (TID) | SUBCUTANEOUS | Status: DC
  Administered 2012-03-27: 2 [IU] via SUBCUTANEOUS
  Administered 2012-03-27: 3 [IU] via SUBCUTANEOUS
  Administered 2012-03-27 – 2012-03-28 (×2): 2 [IU] via SUBCUTANEOUS

## 2012-03-26 MED ORDER — INSULIN GLARGINE 100 UNIT/ML ~~LOC~~ SOLN
60.0000 [IU] | Freq: Every day | SUBCUTANEOUS | Status: DC
  Administered 2012-03-27 (×2): 60 [IU] via SUBCUTANEOUS

## 2012-03-26 MED ORDER — INSULIN ASPART 100 UNIT/ML ~~LOC~~ SOLN
0.0000 [IU] | Freq: Every day | SUBCUTANEOUS | Status: DC
Start: 2012-03-26 — End: 2012-03-28

## 2012-03-26 MED ORDER — POTASSIUM CHLORIDE IN NACL 20-0.9 MEQ/L-% IV SOLN
INTRAVENOUS | Status: AC
  Filled 2012-03-26: qty 1000

## 2012-03-26 MED ORDER — CARVEDILOL 12.5 MG PO TABS
25.0000 mg | ORAL_TABLET | Freq: Every evening | ORAL | Status: DC
  Administered 2012-03-27: 25 mg via ORAL
  Filled 2012-03-26: qty 2

## 2012-03-26 MED ORDER — ONDANSETRON HCL 4 MG/2ML IJ SOLN: 4.0000 mg | Freq: Four times a day (QID) | INTRAMUSCULAR | Status: DC | PRN

## 2012-03-26 MED ORDER — MIRTAZAPINE 30 MG PO TABS
30.0000 mg | ORAL_TABLET | Freq: Every day | ORAL | Status: DC
  Administered 2012-03-27 (×2): 30 mg via ORAL
  Filled 2012-03-26 (×2): qty 1

## 2012-03-26 MED ORDER — POLYETHYLENE GLYCOL 3350 17 GM/SCOOP PO POWD
17.0000 g | Freq: Every day | ORAL | Status: DC
  Filled 2012-03-26: qty 255

## 2012-03-26 MED ORDER — POLYETHYLENE GLYCOL 3350 17 G PO PACK
PACK | ORAL | Status: AC
  Filled 2012-03-26: qty 1

## 2012-03-26 MED ORDER — LISINOPRIL 10 MG PO TABS
10.0000 mg | ORAL_TABLET | Freq: Every evening | ORAL | Status: DC
  Administered 2012-03-27: 10 mg via ORAL
  Filled 2012-03-26: qty 1

## 2012-03-26 MED ORDER — SENNOSIDES-DOCUSATE SODIUM 8.6-50 MG PO TABS: 1.0000 | ORAL_TABLET | Freq: Every evening | ORAL | Status: DC | PRN

## 2012-03-26 MED ORDER — ACETAMINOPHEN 325 MG PO TABS: 650.0000 mg | ORAL_TABLET | ORAL | Status: DC | PRN

## 2012-03-26 MED ORDER — SIMVASTATIN 20 MG PO TABS
40.0000 mg | ORAL_TABLET | Freq: Every day | ORAL | Status: DC
  Administered 2012-03-27: 40 mg via ORAL
  Filled 2012-03-26: qty 2

## 2012-03-26 MED ORDER — SODIUM CHLORIDE 0.9 % IV SOLN
INTRAVENOUS | Status: DC
  Administered 2012-03-27: via INTRAVENOUS
  Filled 2012-03-26 (×2): qty 1000

## 2012-03-26 MED ORDER — OXYCODONE HCL 5 MG PO TABS
5.0000 mg | ORAL_TABLET | ORAL | Status: DC | PRN
  Administered 2012-03-26 – 2012-03-28 (×6): 5 mg via ORAL
  Filled 2012-03-26 (×6): qty 1

## 2012-03-26 NOTE — ED Notes (Addendum)
Pt c/o several falls over the past 2 days. Pt states he is increasingly weak. Pt c/o swelling left foot today. Pt took 1 oxycodone x 2 hours pta. Pt also c/o lower abd pain and constipation x 2 days.

## 2012-03-26 NOTE — ED Provider Notes (Signed)
History     CSN: [id number]  Arrival date & time 03/26/12  1815   First MD Initiated Contact with Patient 03/26/12 1836      Chief Complaint  Patient presents with  . Weakness    (Consider location/radiation/quality/duration/timing/severity/associated sxs/prior treatment) HPI... level V caveat for urgent need for intervention. Patient complains of general weakness, decreased energy, not feeling well for the past 24 hours. Wife said he fell approximately 12 times yesterday. Status post cerebral vascular accident in 2010 leaving his left side very weak. He does ambulate though when he is feeling well. Complains of left hip pain after fall. Symptoms are moderate to severe. He makes them better or worse.  Past Medical History  Diagnosis Date  . Type 2 diabetes mellitus   . Hyperlipidemia   . Essential hypertension, benign   . Coronary artery disease     Possible - poorly documented  . Chronic pain   . Diverticulitis   . COPD (chronic obstructive pulmonary disease)   . Anxiety   . Depression   . Cerebrovascular disease   . Nonischemic cardiomyopathy     LVEF 30-35% 5/11  . MRSA (methicillin resistant Staphylococcus aureus) septicemia     Past Surgical History  Procedure Date  . Right gluteal abscess drainage 2005  . Abdominal surgery 2004  . Cardiac defibrillator placement 2005    [facility] - Dr. Severiano Gilbert, [facility]  . Ercp with stone extraction 2010  . Arthroscopic left shoulder surgery 2011    Family History  Problem Relation Age of Onset  . Heart disease    . Cancer    . Diabetes      History  Substance Use Topics  . Smoking status: Current Everyday Smoker    Types: Cigarettes  . Smokeless tobacco: Never Used  . Alcohol Use: No      Review of Systems  Unable to perform ROS   Allergies  Morphine  Home Medications   Current Outpatient Rx  Name Route Sig Dispense Refill  . BUPROPION HCL ER (SR) 150 MG PO TB12 Oral Take 150 mg by mouth 2 (two)  times daily.      Marland Kitchen CARVEDILOL 25 MG PO TABS Oral Take 25 mg by mouth 2 (two) times daily with a meal.      . CITALOPRAM HYDROBROMIDE 40 MG PO TABS Oral Take 20 mg by mouth daily.     Marland Kitchen DOCUSATE SODIUM 100 MG PO CAPS Oral Take 100 mg by mouth 2 (two) times daily.    Marland Kitchen ESCITALOPRAM OXALATE 10 MG PO TABS Oral Take 10 mg by mouth daily.    . FUROSEMIDE 20 MG PO TABS  Take 1 to 2 tablets daily PRN for edema 60 tablet 6  . INSULIN GLARGINE 100 UNIT/ML [place] SOLN Subcutaneous Inject 72 Units into the skin at bedtime.     Marland Kitchen LISINOPRIL 10 MG PO TABS Oral Take 10 mg by mouth daily.      . MELOXICAM 7.5 MG PO TABS Oral Take 15 mg by mouth daily.    Marland Kitchen MIRTAZAPINE 15 MG PO TABS Oral Take 30 mg by mouth at bedtime.     Marland Kitchen NAPROXEN SODIUM 220 MG PO TABS Oral Take 220 mg by mouth 2 (two) times daily with a meal.      . OXYCODONE HCL ER 20 MG PO TB12 Oral Take 20 mg by mouth every 12 (twelve) hours.    Marland Kitchen SIMVASTATIN 40 MG PO TABS Oral Take 40  mg by mouth daily.        BP 142/75  Pulse 94  Temp 99 F (37.2 C)  Resp 18  Ht 6' (1.829 m)  Wt 224 lb (101.606 kg)  BMI 30.38 kg/m2  SpO2 97%  Physical Exam  Nursing note and vitals reviewed. Constitutional: He is oriented to person, place, and time. He appears well-developed and well-nourished.  HENT:  Head: Normocephalic and atraumatic.  Eyes: Conjunctivae and EOM are normal. Pupils are equal, round, and reactive to light.  Neck: Normal range of motion. Neck supple.  Cardiovascular: Normal rate and regular rhythm.   Pulmonary/Chest: Effort normal and breath sounds normal.  Abdominal: Soft. Bowel sounds are normal.  Musculoskeletal: Normal range of motion.  Neurological: He is alert and oriented to person, place, and time.       Left-sided weakness  Skin: Skin is warm and dry.  Psychiatric: He has a normal mood and affect.    ED Course  Procedures (including critical care time)  Labs Reviewed  CBC - Abnormal; Notable for the following:    WBC 16.5  (*)    All other components within normal limits  DIFFERENTIAL - Abnormal; Notable for the following:    Neutro Abs 12.8 (*)    Monocytes Absolute 1.4 (*)    All other components within normal limits  BASIC METABOLIC PANEL - Abnormal; Notable for the following:    Glucose, Bld 185 (*)    GFR calc non Af Amer 90 (*)    All other components within normal limits  CARDIAC PANEL(CRET KIN+CKTOT+MB+TROPI)  LIPASE, BLOOD  URINALYSIS, ROUTINE W REFLEX MICROSCOPIC   Dg Chest Portable 1 View  03/26/2012  *RADIOLOGY REPORT*  Clinical Data: Weakness.  Swelling of the left foot.  PORTABLE CHEST - 1 VIEW  Comparison: 11/09/2010.  Findings: Unchanged AICD lead, with lead extending into the region of the left subclavian vein.  Cardiopericardial silhouette is within normal limits and unchanged.  Lungs appear clear.  No airspace disease or effusion.  Probable old granulomatous calcifications in the left lung adjacent to the left heart border. Mild right basilar atelectasis is noted.  IMPRESSION:  1.  Stable support apparatus with AICD lead in the region of the left subclavian vein. 2.  No acute cardiopulmonary disease.  Original Report Authenticated By: Andreas Newport, M.D.   Dg Hip Complete Left  03/26/2012  *RADIOLOGY REPORT*  Clinical Data: Fall.  Left hip pain.  Left lower extremity swelling.  LEFT HIP - COMPLETE 2+ VIEW  Comparison: None.  Findings: Technically suboptimal study due to body habitus and technique.  No grossly displaced pelvic ring fractures or sacral fracture is identified.  Dedicated views of the left hip appear within normal limits aside from mild osteoarthritis.  Surgical clips noted in the abdomen and pelvis.  IMPRESSION: No acute abnormality.  Mild bilateral hip osteoarthritis.  Original Report Authenticated By: Andreas Newport, M.D.   Ct Head Wo Contrast  03/26/2012  *RADIOLOGY REPORT*  Clinical Data: 56 year old male with falls and weakness.  History of 2010 stroke.  CT HEAD WITHOUT CONTRAST   Technique:  Contiguous axial images were obtained from the base of the skull through the vertex without contrast.  Comparison: 11/21/2010 and earlier.  Findings: Visualized paranasal sinuses and mastoids are clear.  No acute osseous abnormality identified.  Visualized orbits and scalp soft tissues are within normal limits.  Calcified atherosclerosis at the skull base.  Chronic right MCA and PCA infarct.  Superimposed subacute appearing posterior right MCA  infarct effecting the right frontal and parietal lobes at the vertex, new since prior.  Petechial hemorrhage (series 2 image 25). No significant mass effect.  No extra-axial hemorrhage.  Stable and normal gray-white matter differentiation in the left hemisphere and posterior fossa.  Stable ex vacuo changes to the ventricles.  Patent basilar cisterns. No suspicious intracranial vascular hyperdensity.  No intracranial mass lesion.  IMPRESSION: 1.  Subacute on chronic right MCA infarct. Associated petechial hemorrhage but no associated mass effect. 2.  Chronic right PCA infarct is stable.  Study discussed by telephone with Dr. Donnetta Hutching on 03/26/2012 at 1944 hours.  Original Report Authenticated By: Harley Hallmark, M.D.   Dg Chest Portable 1 View  03/26/2012  *RADIOLOGY REPORT*  Clinical Data: Weakness.  Swelling of the left foot.  PORTABLE CHEST - 1 VIEW  Comparison: 11/09/2010.  Findings: Unchanged AICD lead, with lead extending into the region of the left subclavian vein.  Cardiopericardial silhouette is within normal limits and unchanged.  Lungs appear clear.  No airspace disease or effusion.  Probable old granulomatous calcifications in the left lung adjacent to the left heart border. Mild right basilar atelectasis is noted.  IMPRESSION:  1.  Stable support apparatus with AICD lead in the region of the left subclavian vein. 2.  No acute cardiopulmonary disease.  Original Report Authenticated By: Andreas Newport, M.D.    No diagnosis found.  Date:  03/26/2012  Rate: 87  Rhythm: normal sinus rhythm  QRS Axis: normal  Intervals: normal  ST/T Wave abnormalities: normal  Conduction Disutrbances: none  Narrative Interpretation: unremarkable   CRITICAL CARE Performed by: Donnetta Hutching  ?  Total critical care time: 30  Critical care time was exclusive of separately billable procedures and treating other patients.  Critical care was necessary to treat or prevent imminent or life-threatening deterioration.  Critical care was time spent personally by me on the following activities: development of treatment plan with patient and/or surrogate as well as nursing, discussions with consultants, evaluation of patient's response to treatment, examination of patient, obtaining history from patient or surrogate, ordering and performing treatments and interventions, ordering and review of laboratory studies, ordering and review of radiographic studies, pulse oximetry and re-evaluation of patient's condition.     MDM   CT scan of head shows subacute on chronic right MCA infarct. Discussed case with radiologist. Will admit to general medicine.       Donnetta Hutching, MD 03/26/12 2050

## 2012-03-26 NOTE — ED Notes (Signed)
Patient transported to X-ray 

## 2012-03-26 NOTE — H&P (Signed)
PCP:   Kirk Ruths, MD, MD   Cardiologist: Nona Dell M.D.   Chief Complaint:  Recurrent falls x 2-3 days  HPI: Peter Long is an 56 y.o. male.   Caucasian gentleman with multiple medical problems, including diabetes, hypertension, hyperlipidemia, cardiomyopathy s/p AICD placement 2005,past history of stroke with residual left hemiplegia, chronic arthritic pain, tobacco and marijuana dependence. Usually ambulates without assistance, but is now brought in by his wife reporting since Saturday is been much weaker and having recurrent falls, to 12 times per day. In fact patient now reports that he's actually been feeling weakness of thigh muscles bilaterally for much longer but did not report it. Patient has developed extensive bruising of the left forearm due to using it to help support himself during falls. His wife has the forearm wrapped in bandages, to protect bed sheets and furniture from blood.  He denies any speech or swallowing difficulty, denies any new visual focal lateralizing deficits.  In the emergency room patient had a CT scan of the brain showed a subacute right frontoparietal infarct, with petechial hemorrhages. Hospitalist service was called to assist with management.  Extensive review of this gentleman medical records reveal, he was first admitted for ischemic stroke at [facility] in November 2010, one week after a work-related head injury; it is difficult for him to believe that the stroke was not related to the blow to the head. Through some misunderstanding he signed himself out AGAINST MEDICAL ADVICE, but was readmitted to [facility] 3 days later because he couldn't function at home. He had the benefit of a neurological evaluation including TTE, but no unusual cause of his stroke at such an early age would be identified. While in hospital he was abusive to the staff and a psychiatric consult with Dr. Horton Marshall identified the cause of his behavior as  multifactorial, including his major depression, marijuana dependence and withdrawal, and probably an effect of the stroke itself; he did not seem to have a true appreciation of how sick he really was. He was persuaded to stay to complete his evaluation eventually discharged to rehabilitation. He was readmitted to [facility] about one month later for worsening gait disturbance, but no new stroke was found. He was again abusive to staff.  During the interview today, patient is cooperative but still shows signs of not being fully trusting of medical professionals although he wishes to have optimum care.   -He has chronic mild left hemiplegia and left visual field deficit, from previous stroke. -He sometimes has burning of the soles of his right foot; he has impaired sensation in the left foot and numbness of the left upper extremity -He has chronic pains from a left rotator cuff tear, and reportedly from a large ventral hernia  -He reports he is not on aspirin or Plavix, but doesn't know why; he has never been told to avoid blood thinners. -Takes Lasix episodically for leg edema    Rewiew of Systems:  The patient denies anorexia, fever, weight loss,,  decreased hearing, hoarseness, chest pain, syncope, dyspnea on exertion, balance deficits, hemoptysis, abdominal pain, melena, hematochezia, severe indigestion/heartburn, hematuria, incontinence, genital sores, suspicious skin lesions, transient blindness, unusual weight change, abnormal bleeding, enlarged lymph nodes, angioedema, and breast masses.    Past Medical History  Diagnosis Date  . Type 2 diabetes mellitus   . Hyperlipidemia   . Essential hypertension, benign   . Coronary artery disease     Possible - poorly documented  .  Chronic pain   . Diverticulitis   . COPD (chronic obstructive pulmonary disease)   . Anxiety   . Depression   . Cerebrovascular disease   . Nonischemic cardiomyopathy     LVEF 30-35% 5/11  . MRSA  (methicillin resistant Staphylococcus aureus) septicemia   . Diabetes mellitus     Past Surgical History  Procedure Date  . Right gluteal abscess drainage 2005  . Abdominal surgery 2004  . Cardiac defibrillator placement 2005    [facility] - Dr. Severiano Gilbert, [employer] [facility] VR  . Ercp with stone extraction 2010  . Arthroscopic left shoulder surgery 2011    Medications:  HOME MEDS: Prior to Admission medications   Medication Sig Start Date End Date Taking? Authorizing Provider  carvedilol (COREG) 25 MG tablet Take 25 mg by mouth every evening.    Yes Historical Provider, MD  citalopram (CELEXA) 20 MG tablet Take 20 mg by mouth every evening.   Yes Historical Provider, MD  escitalopram (LEXAPRO) 10 MG tablet Take 10 mg by mouth every evening.    Yes Historical Provider, MD  furosemide (LASIX) 20 MG tablet Take 20 mg by mouth every evening. for edema 01/30/12  Yes Jonelle Sidle, MD  insulin glargine (LANTUS) 100 UNIT/ML injection Inject 72 Units into the skin at bedtime.    Yes Historical Provider, MD  lisinopril (PRINIVIL,ZESTRIL) 10 MG tablet Take 10 mg by mouth every evening.    Yes Historical Provider, MD  meloxicam (MOBIC) 7.5 MG tablet Take 15 mg by mouth every evening.    Yes Historical Provider, MD  mirtazapine (REMERON) 15 MG tablet Take 30 mg by mouth at bedtime.    Yes Historical Provider, MD  oxyCODONE (OXYCONTIN) 20 MG 12 hr tablet Take 20 mg by mouth every 12 (twelve) hours. **Take as needed for pain   Yes Historical Provider, MD  polyethylene glycol powder (GLYCOLAX/MIRALAX) powder Take 17 g by mouth daily.   Yes Historical Provider, MD  buPROPion (WELLBUTRIN SR) 150 MG 12 hr tablet Take 150 mg by mouth 2 (two) times daily.      Historical Provider, MD  naproxen sodium (ANAPROX) 220 MG tablet Take 220 mg by mouth 2 (two) times daily with a meal.      Historical Provider, MD  simvastatin (ZOCOR) 40 MG tablet Take 40 mg by mouth daily.      Historical Provider, MD      Allergies:  Allergies  Allergen Reactions  . Morphine     Social History:   reports that he has been smoking Cigarettes.  He has a 45 pack-year smoking history. He has never used smokeless tobacco. He reports that he does not drink alcohol or use illicit drugs.  Family History: Family History  Problem Relation Age of Onset  . Heart disease    . Cancer    . Diabetes       Physical Exam: Filed Vitals:   03/26/12 2000 03/26/12 2004 03/26/12 2142 03/26/12 2203  BP:   129/67 165/89  Pulse: 83  89 89  Temp:  98.6 F (37 C)  98.6 F (37 C)  TempSrc:    Oral  Resp: 16  14 14   Height:    6' (1.829 m)  Weight:    101.606 kg (224 lb)  SpO2: 98%  97% 96%   Blood pressure 165/89, pulse 89, temperature 98.6 F (37 C), temperature source Oral, resp. rate 14, height 6' (1.829 m), weight 101.606 kg (224 lb), SpO2 96.00%.  GEN:  Depressed-looking middle-aged Caucasian gentleman lying in the stretcher in no acute distress; cooperative with exam PSYCH:  alert and oriented x4; affect is guarded HEENT: Mucous membranes pink and anicteric; PERRLA; EOM intact; no cervical lymphadenopathy nor thyromegaly or carotid bruit; no JVD; Breasts:: Not examined CHEST WALL: No tenderness CHEST: Normal respiration, clear to auscultation bilaterally HEART: Regular rate and rhythm; no murmurs rubs or gallops BACK: no CVA tenderness ABDOMEN: Obese, soft non-tender; scars from previous abdominal surgery; large ventral hernia, no organomegaly, normal abdominal bowel sounds; no intertriginous candida. Rectal Exam: Not done EXTREMITIES: Extensive ecchymosis of the left forearm; with bleeding denuded skin/ stage II ulcers in some areas; multiple healing bruises of the left greater than right lower extremities; no edema; some wasting of the muscles of the left leg Genitalia: not examined PULSES: 2+ and symmetric SKIN: Petechial/ecchymoses areas noted on arms, abdomen, chest legs CNS: Cranial nerves 3-12  grossly; 3/5 weakness of left upper and lower extremity; full 5 over 5 power on the right upper and lower extremity.   Labs & Imaging Results for orders placed during the hospital encounter of 03/26/12 (from the past 48 hour(s))  CBC     Status: Abnormal   Collection Time   03/26/12  6:22 PM      Component Value Range Comment   WBC 16.5 (*) 4.0 - 10.5 (K/uL)    RBC 5.22  4.22 - 5.81 (MIL/uL)    Hemoglobin 15.9  13.0 - 17.0 (g/dL)    HCT 16.1  09.6 - 04.5 (%)    MCV 87.7  78.0 - 100.0 (fL)    MCH 30.5  26.0 - 34.0 (pg)    MCHC 34.7  30.0 - 36.0 (g/dL)    RDW 40.9  81.1 - 91.4 (%)    Platelets 155  150 - 400 (K/uL)   DIFFERENTIAL     Status: Abnormal   Collection Time   03/26/12  6:22 PM      Component Value Range Comment   Neutrophils Relative 77  43 - 77 (%)    Neutro Abs 12.8 (*) 1.7 - 7.7 (K/uL)    Lymphocytes Relative 12  12 - 46 (%)    Lymphs Abs 2.1  0.7 - 4.0 (K/uL)    Monocytes Relative 8  3 - 12 (%)    Monocytes Absolute 1.4 (*) 0.1 - 1.0 (K/uL)    Eosinophils Relative 2  0 - 5 (%)    Eosinophils Absolute 0.3  0.0 - 0.7 (K/uL)    Basophils Relative 0  0 - 1 (%)    Basophils Absolute 0.0  0.0 - 0.1 (K/uL)   BASIC METABOLIC PANEL     Status: Abnormal   Collection Time   03/26/12  6:22 PM      Component Value Range Comment   Sodium 136  135 - 145 (mEq/L)    Potassium 4.0  3.5 - 5.1 (mEq/L)    Chloride 98  96 - 112 (mEq/L)    CO2 29  19 - 32 (mEq/L)    Glucose, Bld 185 (*) 70 - 99 (mg/dL)    BUN 15  6 - 23 (mg/dL)    Creatinine, Ser 7.82  0.50 - 1.35 (mg/dL)    Calcium 9.4  8.4 - 10.5 (mg/dL)    GFR calc non Af Amer 90 (*) >90 (mL/min)    GFR calc Af Amer >90  >90 (mL/min)   LIPASE, BLOOD     Status: Normal   Collection Time  03/26/12  6:22 PM      Component Value Range Comment   Lipase 26  11 - 59 (U/L)   CARDIAC PANEL(CRET KIN+CKTOT+MB+TROPI)     Status: Abnormal   Collection Time   03/26/12  6:22 PM      Component Value Range Comment   Total CK 759 (*) 7 - 232  (U/L)    CK, MB 7.8 (*) 0.3 - 4.0 (ng/mL)    Troponin I <0.30  <0.30 (ng/mL)    Relative Index 1.0  0.0 - 2.5    URINALYSIS, ROUTINE W REFLEX MICROSCOPIC     Status: Abnormal   Collection Time   03/26/12  9:42 PM      Component Value Range Comment   Color, Urine AMBER (*) YELLOW  BIOCHEMICALS MAY BE AFFECTED BY COLOR   APPearance CLEAR  CLEAR     Specific Gravity, Urine 1.020  1.005 - 1.030     pH 6.0  5.0 - 8.0     Glucose, UA 250 (*) NEGATIVE (mg/dL)    Hgb urine dipstick NEGATIVE  NEGATIVE     Bilirubin Urine NEGATIVE  NEGATIVE     Ketones, ur NEGATIVE  NEGATIVE (mg/dL)    Protein, ur NEGATIVE  NEGATIVE (mg/dL)    Urobilinogen, UA 1.0  0.0 - 1.0 (mg/dL)    Nitrite NEGATIVE  NEGATIVE     Leukocytes, UA NEGATIVE  NEGATIVE  MICROSCOPIC NOT DONE ON URINES WITH NEGATIVE PROTEIN, BLOOD, LEUKOCYTES, NITRITE, OR GLUCOSE <1000 mg/dL.  URINE RAPID DRUG SCREEN (HOSP PERFORMED)     Status: Abnormal   Collection Time   03/26/12  9:42 PM      Component Value Range Comment   Opiates NONE DETECTED  NONE DETECTED     Cocaine NONE DETECTED  NONE DETECTED     Benzodiazepines POSITIVE (*) NONE DETECTED     Amphetamines NONE DETECTED  NONE DETECTED     Tetrahydrocannabinol POSITIVE (*) NONE DETECTED     Barbiturates NONE DETECTED  NONE DETECTED     Dg Hip Complete Left  03/26/2012  *RADIOLOGY REPORT*  Clinical Data: Fall.  Left hip pain.  Left lower extremity swelling.  LEFT HIP - COMPLETE 2+ VIEW  Comparison: None.  Findings: Technically suboptimal study due to body habitus and technique.  No grossly displaced pelvic ring fractures or sacral fracture is identified.  Dedicated views of the left hip appear within normal limits aside from mild osteoarthritis.  Surgical clips noted in the abdomen and pelvis.  IMPRESSION: No acute abnormality.  Mild bilateral hip osteoarthritis.  Original Report Authenticated By: Andreas Newport, M.D.   Ct Head Wo Contrast  03/26/2012  *RADIOLOGY REPORT*  Clinical Data:  56 year old male with falls and weakness.  History of 2010 stroke.  CT HEAD WITHOUT CONTRAST  Technique:  Contiguous axial images were obtained from the base of the skull through the vertex without contrast.  Comparison: 11/21/2010 and earlier.  Findings: Visualized paranasal sinuses and mastoids are clear.  No acute osseous abnormality identified.  Visualized orbits and scalp soft tissues are within normal limits.  Calcified atherosclerosis at the skull base.  Chronic right MCA and PCA infarct.  Superimposed subacute appearing posterior right MCA infarct effecting the right frontal and parietal lobes at the vertex, new since prior.  Petechial hemorrhage (series 2 image 25). No significant mass effect.  No extra-axial hemorrhage.  Stable and normal gray-white matter differentiation in the left hemisphere and posterior fossa.  Stable ex vacuo changes to the ventricles.  Patent basilar cisterns. No suspicious intracranial vascular hyperdensity.  No intracranial mass lesion.  IMPRESSION: 1.  Subacute on chronic right MCA infarct. Associated petechial hemorrhage but no associated mass effect. 2.  Chronic right PCA infarct is stable.  Study discussed by telephone with Dr. Donnetta Hutching on 03/26/2012 at 1944 hours.  Original Report Authenticated By: Harley Hallmark, M.D.   Dg Chest Portable 1 View  03/26/2012  *RADIOLOGY REPORT*  Clinical Data: Weakness.  Swelling of the left foot.  PORTABLE CHEST - 1 VIEW  Comparison: 11/09/2010.  Findings: Unchanged AICD lead, with lead extending into the region of the left subclavian vein.  Cardiopericardial silhouette is within normal limits and unchanged.  Lungs appear clear.  No airspace disease or effusion.  Probable old granulomatous calcifications in the left lung adjacent to the left heart border. Mild right basilar atelectasis is noted.  IMPRESSION:  1.  Stable support apparatus with AICD lead in the region of the left subclavian vein. 2.  No acute cardiopulmonary disease.   Original Report Authenticated By: Andreas Newport, M.D.      Assessment Present on Admission:  . subacute Cerebral vascular accident, with secondary bleed  Chronic left hemiplegia due to previous stroke  .Type 2 diabetes mellitus, peripheral neuropathy .Mixed hyperlipidemia .Essential hypertension, benign  .Chronic pain .Depression .Anxiety .Tobacco abuse .Marijuana dependence, uses for pain management   .CORONARY ATHEROSCLEROSIS NATIVE CORONARY ARTERY .UNSPECIFIED SECONDARY CARDIOMYOPATHY .SHOULDER PAIN .AUTOMATIC IMPLANTABLE CARDIAC DEFIBRILLATOR SITU  PLAN: This patient's recurrent falls likely the result of a subacute stroke, along with pain medications and marijuana dependence.  Admit to evaluate and optimize his stroke risk factors, and given the benefit of a neurology consult to assist with this. We'll withhold aspirin and Heparin for the time being, since there is some secondary bleeding associated with the stroke; will use SCDs for DVT prophylaxis.  Check homocystine levels, RPR, CRP, ESR and a lipid panel  Counsel on nicotine and marijuana cessation, and on a nicotine replacement Continue bupropion, Remeron and Celexa for depression; hold Lexapro for the time being since this is also an SSRI. Consider a psychiatric consult to assist with antidepressant drug combinations.  Liver slightly reduced dose of Lantus since he was hospitalized and combined with sliding scale; follow up hemoglobin A1c.  Patient is not a candidate for MRI because of his AICD and I'm not sure that there is a value to further investigation with contrasted CT; will defer to neurology.  Consider adding an antiepileptic to manage both his diabetic neuropathic pain, as well as his mood disturbance; this was previously suggested by his psychiatrist but not followup on.   Other plans as per orders.  Total time spent interviewing evaluating and managing this patient, and reviewing his records: 120  minutes.  Cressida Milford 03/26/2012, 10:48 PM

## 2012-03-26 NOTE — ED Notes (Signed)
CRITICAL VALUE ALERT  Critical value received:  CKMB 7.8  Date of notification:  03/26/12  Time of notification:  1946  Critical value read back:yes Nurse who received alert:  Bdaniels, RN  MD notified (1st page):  Adriana Simas  Time of first page:  1946  MD notified (2nd page):  Time of second page:  Responding MD:  Adriana Simas  Time MD responded:  194

## 2012-03-27 ENCOUNTER — Encounter (HOSPITAL_COMMUNITY): Payer: Self-pay | Admitting: Radiology

## 2012-03-27 ENCOUNTER — Inpatient Hospital Stay (HOSPITAL_COMMUNITY): Payer: Medicare Other

## 2012-03-27 DIAGNOSIS — E1049 Type 1 diabetes mellitus with other diabetic neurological complication: Secondary | ICD-10-CM

## 2012-03-27 DIAGNOSIS — I517 Cardiomegaly: Secondary | ICD-10-CM

## 2012-03-27 DIAGNOSIS — I6330 Cerebral infarction due to thrombosis of unspecified cerebral artery: Secondary | ICD-10-CM

## 2012-03-27 DIAGNOSIS — F1220 Cannabis dependence, uncomplicated: Secondary | ICD-10-CM

## 2012-03-27 DIAGNOSIS — F17200 Nicotine dependence, unspecified, uncomplicated: Secondary | ICD-10-CM

## 2012-03-27 LAB — RPR: RPR Ser Ql: NONREACTIVE

## 2012-03-27 LAB — LIPID PANEL
Cholesterol: 167 mg/dL (ref 0–200)
HDL: 33 mg/dL — ABNORMAL LOW (ref >39)
Total CHOL/HDL Ratio: 5.1 RATIO
Triglycerides: 242 mg/dL — ABNORMAL HIGH (ref <150)
VLDL: 48 mg/dL — ABNORMAL HIGH (ref 0–40)

## 2012-03-27 LAB — HEMOGLOBIN A1C: Mean Plasma Glucose: 180 mg/dL — ABNORMAL HIGH (ref <117)

## 2012-03-27 LAB — GLUCOSE, CAPILLARY
Glucose-Capillary: 136 mg/dL — ABNORMAL HIGH (ref 70–99)
Glucose-Capillary: 146 mg/dL — ABNORMAL HIGH (ref 70–99)
Glucose-Capillary: 159 mg/dL — ABNORMAL HIGH (ref 70–99)

## 2012-03-27 MED ORDER — CLOPIDOGREL BISULFATE 75 MG PO TABS: 75.0000 mg | ORAL_TABLET | Freq: Every day | ORAL | Status: DC

## 2012-03-27 MED ORDER — POTASSIUM CHLORIDE IN NACL 20-0.9 MEQ/L-% IV SOLN
INTRAVENOUS | Status: DC
  Administered 2012-03-27: 14:00:00 via INTRAVENOUS
  Administered 2012-03-28: 1000 mL via INTRAVENOUS

## 2012-03-27 MED ORDER — POLYETHYLENE GLYCOL 3350 17 G PO PACK
17.0000 g | PACK | Freq: Every day | ORAL | Status: DC
  Administered 2012-03-27 – 2012-03-28 (×2): 17 g via ORAL
  Filled 2012-03-27 (×2): qty 1

## 2012-03-27 MED ORDER — ASPIRIN 325 MG PO TABS
325.0000 mg | ORAL_TABLET | Freq: Every day | ORAL | Status: DC
  Administered 2012-03-27 – 2012-03-28 (×2): 325 mg via ORAL
  Filled 2012-03-27 (×2): qty 1

## 2012-03-27 MED ORDER — BUPROPION HCL ER (SR) 150 MG PO TB12
150.0000 mg | ORAL_TABLET | Freq: Two times a day (BID) | ORAL | Status: DC
  Administered 2012-03-27 – 2012-03-28 (×3): 150 mg via ORAL
  Filled 2012-03-27 (×7): qty 1

## 2012-03-27 NOTE — Evaluation (Signed)
Physical Therapy Evaluation Patient Details Name: Peter Long MRN: [id number] DOB: January 05, 1956 Today's Date: 03/27/2012 Time: 4098-1191 PT Time Calculation (min): 45 min  PT Assessment / Plan / Recommendation Clinical Impression  Pt was cooperative at time of my visit.  According to his wife's and his report, he had nearly fully recovered from his initial stroke except for a lack of L periheral vision.  Currently, all motor activity is intact on L, but he has moderate tone in L extremeties which impede mobility.  He tends to be rather impulsive with all movements which puts him a a very high risk of fall.  I strongly recommend SNF or CIR for this pt, but his wife states that they want to get "to the bottom of this" (i,e, why he is having multiple strokes) and therefore aren't focusing on rehab yet.    PT Assessment  Patient needs continued PT services    Follow Up Recommendations  Skilled nursing facility;Inpatient Rehab    Equipment Recommendations  Defer to next venue    Frequency Min 5X/week    Precautions / Restrictions Precautions Precautions: Fall Restrictions Weight Bearing Restrictions: No   Pertinent Vitals/Pain       Mobility  Bed Mobility Bed Mobility: Supine to Sit;Sit to Supine;Scooting to HOB Supine to Sit: 6: Modified independent (Device/Increase time) Sit to Supine: 6: Modified independent (Device/Increase time) Scooting to [facility]: 6: Modified independent (Device/Increase time) Transfers Transfers: Sit to Stand;Stand to Sit Sit to Stand: 4: Min guard Stand to Sit: 4: Min guard Details for Transfer Assistance: pt tends to be impulsive which decreases overall safety during transfers Ambulation/Gait Ambulation/Gait Assistance: 4: Min assist Assistive device: Rolling walker Ambulation/Gait Assistance Details: pt would be best suited to a hemi walker at this time due to tone in LUE Gait Pattern: Decreased stance time - left;Trunk rotated posteriorly on left;Left  circumduction;Decreased weight shift to left;Decreased dorsiflexion - left Gait velocity: tends to be rapid General Gait Details: pt had episodes of LOB during gait  due to rapidity of abnormal patterns of movement Stairs: No Wheelchair Mobility Wheelchair Mobility: No    Exercises     PT Goals Acute Rehab PT Goals PT Goal Formulation: With patient/family Time For Goal Achievement: 04/03/12 Potential to Achieve Goals: Fair Pt will Stand: 1 - 2 min;with no upper extremity support (able to weight shift to L) PT Goal: Stand - Progress: Goal set today Pt will Ambulate: 16 - 50 feet;with supervision;with other equipment (comment) (with hemi walker) PT Goal: Ambulate - Progress: Goal set today  Visit Information  Last PT Received On: 03/27/12    Subjective Data  Subjective: I'm not going to no nursing home Patient Stated Goal: non stated   Prior Functioning  Home Living Lives With: Spouse Available Help at Discharge: Family Type of Home: House Home Access: Ramped entrance Home Layout: One level Firefighter: Standard Home Adaptive Equipment: None;Straight cane Prior Function Level of Independence: Independent Able to Take Stairs?: Yes Driving: No Communication Communication: No difficulties    Cognition  Overall Cognitive Status: Appears within functional limits for tasks assessed/performed Arousal/Alertness: Awake/alert Orientation Level: Appears intact for tasks assessed Behavior During Session: [name] for tasks performed    Extremity/Trunk Assessment Right Upper Extremity Assessment RUE ROM/Strength/Tone: Within functional levels RUE Sensation: WFL - Light Touch;WFL - Proprioception RUE Coordination: WFL - gross/fine motor Left Upper Extremity Assessment LUE ROM/Strength/Tone: Deficits LUE ROM/Strength/Tone Deficits: moderate flexor tone in UE which pt is able to pull (to a small degree)  out of when he takes his time LUE Sensation: WFL - Light Touch;Deficits LUE  Sensation Deficits: poor proprioception LUE LUE Coordination: Deficits LUE Coordination Deficits: unable to use LUE functionally Right Lower Extremity Assessment RLE ROM/Strength/Tone: Within functional levels RLE Sensation: WFL - Light Touch;WFL - Proprioception RLE Coordination: WFL - gross motor Left Lower Extremity Assessment LLE ROM/Strength/Tone: Deficits LLE ROM/Strength/Tone Deficits: has relatively good strength, but moderate extensor tone, especially evident during functional adtivities LLE Sensation: Deficits LLE Sensation Deficits: had great difficulty placing LLE appropriately LLE Coordination: Deficits LLE Coordination Deficits: gait typical of a L Hemi Trunk Assessment Trunk Assessment: Normal   Balance Balance Balance Assessed: Yes Static Sitting Balance Static Sitting - Level of Assistance: 7: Independent Dynamic Sitting Balance Dynamic Sitting - Level of Assistance: 6: Modified independent (Device/Increase time) Static Standing Balance Static Standing - Level of Assistance: 5: Stand by assistance Dynamic Standing Balance Dynamic Standing - Level of Assistance: 3: Mod assist  End of Session PT - End of Session Equipment Utilized During Treatment: Gait belt Activity Tolerance: Patient tolerated treatment well Patient left: in bed;with call bell/phone within reach;with bed alarm set;with family/visitor present Nurse Communication: Mobility status   Peter Long 03/27/2012, 12:02 PM

## 2012-03-27 NOTE — Consult Note (Signed)
Reason for Consult: Referring Physician:   AURON TADROS is an 56 y.o. male.  HPI:  Past Medical History  Diagnosis Date  . Type 2 diabetes mellitus   . Hyperlipidemia   . Essential hypertension, benign   . Coronary artery disease     Possible - poorly documented  . Chronic pain   . Diverticulitis   . COPD (chronic obstructive pulmonary disease)   . Anxiety   . Depression   . Cerebrovascular disease   . Nonischemic cardiomyopathy     LVEF 30-35% 5/11  . MRSA (methicillin resistant Staphylococcus aureus) septicemia   . Diabetes mellitus     Past Surgical History  Procedure Date  . Right gluteal abscess drainage 2005  . Abdominal surgery 2004  . Cardiac defibrillator placement 2005    [facility] - Dr. Severiano Gilbert, [employer] [facility]  . Ercp with stone extraction 2010  . Arthroscopic left shoulder surgery 2011    Family History  Problem Relation Age of Onset  . Heart disease    . Cancer    . Diabetes      Social History:  reports that he has been smoking Cigarettes.  He has a 45 pack-year smoking history. He has never used smokeless tobacco. He reports that he does not drink alcohol or use illicit drugs.  Allergies:  Allergies  Allergen Reactions  . Morphine     Medications:  Prior to Admission medications   Medication Sig Start Date End Date Taking? Authorizing Provider  carvedilol (COREG) 25 MG tablet Take 25 mg by mouth every evening.    Yes Historical Provider, MD  citalopram (CELEXA) 20 MG tablet Take 20 mg by mouth every evening.   Yes Historical Provider, MD  escitalopram (LEXAPRO) 10 MG tablet Take 10 mg by mouth every evening.    Yes Historical Provider, MD  furosemide (LASIX) 20 MG tablet Take 20 mg by mouth every evening. for edema 01/30/12  Yes Jonelle Sidle, MD  insulin glargine (LANTUS) 100 UNIT/ML injection Inject 72 Units into the skin at bedtime.    Yes Historical Provider, MD  lisinopril (PRINIVIL,ZESTRIL) 10 MG tablet Take 10 mg by mouth every  evening.    Yes Historical Provider, MD  meloxicam (MOBIC) 7.5 MG tablet Take 15 mg by mouth every evening.    Yes Historical Provider, MD  mirtazapine (REMERON) 15 MG tablet Take 30 mg by mouth at bedtime.    Yes Historical Provider, MD  oxyCODONE (OXYCONTIN) 20 MG 12 hr tablet Take 20 mg by mouth every 12 (twelve) hours. **Take as needed for pain   Yes Historical Provider, MD  polyethylene glycol powder (GLYCOLAX/MIRALAX) powder Take 17 g by mouth daily.   Yes Historical Provider, MD  buPROPion (WELLBUTRIN SR) 150 MG 12 hr tablet Take 150 mg by mouth 2 (two) times daily.      Historical Provider, MD  naproxen sodium (ANAPROX) 220 MG tablet Take 220 mg by mouth 2 (two) times daily with a meal.      Historical Provider, MD  simvastatin (ZOCOR) 40 MG tablet Take 40 mg by mouth daily.      Historical Provider, MD   Scheduled Meds:   . buPROPion  150 mg Oral BID  . carvedilol  25 mg Oral QPM  . citalopram  20 mg Oral QPM  . clopidogrel  75 mg Oral Q breakfast  . insulin aspart  0-15 Units Subcutaneous TID WC  . insulin aspart  0-5 Units Subcutaneous QHS  .  insulin glargine  60 Units Subcutaneous QHS  . lisinopril  10 mg Oral QPM  . mirtazapine  30 mg Oral QHS  . nicotine  21 mg Transdermal Daily  . polyethylene glycol  17 g Oral Daily  . simvastatin  40 mg Oral q1800  . DISCONTD: buPROPion  150 mg Oral BID  . DISCONTD: polyethylene glycol powder  17 g Oral Daily   Continuous Infusions:   . 0.9 % NaCl with KCl 20 mEq / L    . DISCONTD: sodium chloride 0.9 % 1,000 mL with potassium chloride 20 mEq infusion 75 mL/hr at 03/27/12 0008   PRN Meds:.acetaminophen, acetaminophen, ondansetron (ZOFRAN) IV, oxyCODONE, senna-docusate   Results for orders placed during the hospital encounter of 03/26/12 (from the past 48 hour(s))  CBC     Status: Abnormal   Collection Time   03/26/12  6:22 PM      Component Value Range Comment   WBC 16.5 (*) 4.0 - 10.5 (K/uL)    RBC 5.22  4.22 - 5.81 (MIL/uL)      Hemoglobin 15.9  13.0 - 17.0 (g/dL)    HCT 16.1  09.6 - 04.5 (%)    MCV 87.7  78.0 - 100.0 (fL)    MCH 30.5  26.0 - 34.0 (pg)    MCHC 34.7  30.0 - 36.0 (g/dL)    RDW 40.9  81.1 - 91.4 (%)    Platelets 155  150 - 400 (K/uL)   DIFFERENTIAL     Status: Abnormal   Collection Time   03/26/12  6:22 PM      Component Value Range Comment   Neutrophils Relative 77  43 - 77 (%)    Neutro Abs 12.8 (*) 1.7 - 7.7 (K/uL)    Lymphocytes Relative 12  12 - 46 (%)    Lymphs Abs 2.1  0.7 - 4.0 (K/uL)    Monocytes Relative 8  3 - 12 (%)    Monocytes Absolute 1.4 (*) 0.1 - 1.0 (K/uL)    Eosinophils Relative 2  0 - 5 (%)    Eosinophils Absolute 0.3  0.0 - 0.7 (K/uL)    Basophils Relative 0  0 - 1 (%)    Basophils Absolute 0.0  0.0 - 0.1 (K/uL)   BASIC METABOLIC PANEL     Status: Abnormal   Collection Time   03/26/12  6:22 PM      Component Value Range Comment   Sodium 136  135 - 145 (mEq/L)    Potassium 4.0  3.5 - 5.1 (mEq/L)    Chloride 98  96 - 112 (mEq/L)    CO2 29  19 - 32 (mEq/L)    Glucose, Bld 185 (*) 70 - 99 (mg/dL)    BUN 15  6 - 23 (mg/dL)    Creatinine, Ser 7.82  0.50 - 1.35 (mg/dL)    Calcium 9.4  8.4 - 10.5 (mg/dL)    GFR calc non Af Amer 90 (*) >90 (mL/min)    GFR calc Af Amer >90  >90 (mL/min)   LIPASE, BLOOD     Status: Normal   Collection Time   03/26/12  6:22 PM      Component Value Range Comment   Lipase 26  11 - 59 (U/L)   CARDIAC PANEL(CRET KIN+CKTOT+MB+TROPI)     Status: Abnormal   Collection Time   03/26/12  6:22 PM      Component Value Range Comment   Total CK 759 (*) 7 - 232 (U/L)  CK, MB 7.8 (*) 0.3 - 4.0 (ng/mL)    Troponin I <0.30  <0.30 (ng/mL)    Relative Index 1.0  0.0 - 2.5    URINALYSIS, ROUTINE W REFLEX MICROSCOPIC     Status: Abnormal   Collection Time   03/26/12  9:42 PM      Component Value Range Comment   Color, Urine AMBER (*) YELLOW  BIOCHEMICALS MAY BE AFFECTED BY COLOR   APPearance CLEAR  CLEAR     Specific Gravity, Urine 1.020  1.005 - 1.030      pH 6.0  5.0 - 8.0     Glucose, UA 250 (*) NEGATIVE (mg/dL)    Hgb urine dipstick NEGATIVE  NEGATIVE     Bilirubin Urine NEGATIVE  NEGATIVE     Ketones, ur NEGATIVE  NEGATIVE (mg/dL)    Protein, ur NEGATIVE  NEGATIVE (mg/dL)    Urobilinogen, UA 1.0  0.0 - 1.0 (mg/dL)    Nitrite NEGATIVE  NEGATIVE     Leukocytes, UA NEGATIVE  NEGATIVE  MICROSCOPIC NOT DONE ON URINES WITH NEGATIVE PROTEIN, BLOOD, LEUKOCYTES, NITRITE, OR GLUCOSE <1000 mg/dL.  URINE RAPID DRUG SCREEN (HOSP PERFORMED)     Status: Abnormal   Collection Time   03/26/12  9:42 PM      Component Value Range Comment   Opiates NONE DETECTED  NONE DETECTED     Cocaine NONE DETECTED  NONE DETECTED     Benzodiazepines POSITIVE (*) NONE DETECTED     Amphetamines NONE DETECTED  NONE DETECTED     Tetrahydrocannabinol POSITIVE (*) NONE DETECTED     Barbiturates NONE DETECTED  NONE DETECTED    MRSA PCR SCREENING     Status: Normal   Collection Time   03/26/12 10:49 PM      Component Value Range Comment   MRSA by PCR NEGATIVE  NEGATIVE    GLUCOSE, CAPILLARY     Status: Abnormal   Collection Time   03/26/12 11:13 PM      Component Value Range Comment   Glucose-Capillary 177 (*) 70 - 99 (mg/dL)   SEDIMENTATION RATE     Status: Abnormal   Collection Time   03/26/12 11:19 PM      Component Value Range Comment   Sed Rate 19 (*) 0 - 16 (mm/hr)   LIPID PANEL     Status: Abnormal   Collection Time   03/27/12  6:03 AM      Component Value Range Comment   Cholesterol 167  0 - 200 (mg/dL)    Triglycerides 629 (*) <150 (mg/dL)    HDL 33 (*) >52 (mg/dL)    Total CHOL/HDL Ratio 5.1      VLDL 48 (*) 0 - 40 (mg/dL)    LDL Cholesterol 86  0 - 99 (mg/dL)   GLUCOSE, CAPILLARY     Status: Abnormal   Collection Time   03/27/12  7:45 AM      Component Value Range Comment   Glucose-Capillary 136 (*) 70 - 99 (mg/dL)     Dg Hip Complete Left  03/26/2012  *RADIOLOGY REPORT*  Clinical Data: Fall.  Left hip pain.  Left lower extremity swelling.  LEFT HIP -  COMPLETE 2+ VIEW  Comparison: None.  Findings: Technically suboptimal study due to body habitus and technique.  No grossly displaced pelvic ring fractures or sacral fracture is identified.  Dedicated views of the left hip appear within normal limits aside from mild osteoarthritis.  Surgical clips noted in the abdomen and  pelvis.  IMPRESSION: No acute abnormality.  Mild bilateral hip osteoarthritis.  Original Report Authenticated By: Andreas Newport, M.D.   Ct Head Wo Contrast  03/26/2012  *RADIOLOGY REPORT*  Clinical Data: 56 year old male with falls and weakness.  History of 2010 stroke.  CT HEAD WITHOUT CONTRAST  Technique:  Contiguous axial images were obtained from the base of the skull through the vertex without contrast.  Comparison: 11/21/2010 and earlier.  Findings: Visualized paranasal sinuses and mastoids are clear.  No acute osseous abnormality identified.  Visualized orbits and scalp soft tissues are within normal limits.  Calcified atherosclerosis at the skull base.  Chronic right MCA and PCA infarct.  Superimposed subacute appearing posterior right MCA infarct effecting the right frontal and parietal lobes at the vertex, new since prior.  Petechial hemorrhage (series 2 image 25). No significant mass effect.  No extra-axial hemorrhage.  Stable and normal gray-white matter differentiation in the left hemisphere and posterior fossa.  Stable ex vacuo changes to the ventricles.  Patent basilar cisterns. No suspicious intracranial vascular hyperdensity.  No intracranial mass lesion.  IMPRESSION: 1.  Subacute on chronic right MCA infarct. Associated petechial hemorrhage but no associated mass effect. 2.  Chronic right PCA infarct is stable.  Study discussed by telephone with Dr. Donnetta Hutching on 03/26/2012 at 1944 hours.  Original Report Authenticated By: Harley Hallmark, M.D.   Dg Chest Portable 1 View  03/26/2012  *RADIOLOGY REPORT*  Clinical Data: Weakness.  Swelling of the left foot.  PORTABLE CHEST - 1  VIEW  Comparison: 11/09/2010.  Findings: Unchanged AICD lead, with lead extending into the region of the left subclavian vein.  Cardiopericardial silhouette is within normal limits and unchanged.  Lungs appear clear.  No airspace disease or effusion.  Probable old granulomatous calcifications in the left lung adjacent to the left heart border. Mild right basilar atelectasis is noted.  IMPRESSION:  1.  Stable support apparatus with AICD lead in the region of the left subclavian vein. 2.  No acute cardiopulmonary disease.  Original Report Authenticated By: Andreas Newport, M.D.    Review of Systems  Constitutional: Negative.   Eyes: Positive for blurred vision.  Respiratory: Negative.   Cardiovascular: Negative.   Gastrointestinal: Negative.   Genitourinary: Negative.   Musculoskeletal: Positive for falls.  Skin: Negative.   Neurological: Positive for headaches.  Endo/Heme/Allergies: Negative.   Psychiatric/Behavioral: Negative.    Blood pressure 155/75, pulse 81, temperature 97.9 F (36.6 C), temperature source Oral, resp. rate 14, height 6' (1.829 m), weight 101.606 kg (224 lb), SpO2 93.00%. Physical Exam  Assessment/Plan: See dict  Jaequan Propes 03/27/2012, 8:59 AM

## 2012-03-27 NOTE — Progress Notes (Signed)
*  PRELIMINARY RESULTS* Echocardiogram 2D Echocardiogram has been performed.  Conrad Chester 03/27/2012, 1:44 PM

## 2012-03-27 NOTE — Progress Notes (Signed)
Subjective: This man was admitted with falls. CT scan of his brain shows subacute on chronic right MCA CVA associated with petechial hemorrhage but no mass effect. Also, there is a chronic right PCA CVA which is stable. He was positive for cannabinoids in his urine drug screen.           Physical Exam: Blood pressure 155/75, pulse 81, temperature 97.9 F (36.6 C), temperature source Oral, resp. rate 14, height 6' (1.829 m), weight 101.606 kg (224 lb), SpO2 93.00%. He is obese. He appears to have left-sided weakness. His speech is normal. He is alert and orientated. Heart sounds are present and normal. Lung fields are clear.   Investigations:  Recent Results (from the past 240 hour(s))  MRSA PCR SCREENING     Status: Normal   Collection Time   03/26/12 10:49 PM      Component Value Range Status Comment   MRSA by PCR NEGATIVE  NEGATIVE  Final      Basic Metabolic Panel:  Basename 03/26/12 1822  NA 136  K 4.0  CL 98  CO2 29  GLUCOSE 185*  BUN 15  CREATININE 0.99  CALCIUM 9.4  MG --  PHOS --   Liver Function Tests: No results found for this basename: AST:2,ALT:2,ALKPHOS:2,BILITOT:2,PROT:2,ALBUMIN:2 in the last 72 hours   CBC:  Basename 03/26/12 1822  WBC 16.5*  NEUTROABS 12.8*  HGB 15.9  HCT 45.8  MCV 87.7  PLT 155    Dg Hip Complete Left  03/26/2012  *RADIOLOGY REPORT*  Clinical Data: Fall.  Left hip pain.  Left lower extremity swelling.  LEFT HIP - COMPLETE 2+ VIEW  Comparison: None.  Findings: Technically suboptimal study due to body habitus and technique.  No grossly displaced pelvic ring fractures or sacral fracture is identified.  Dedicated views of the left hip appear within normal limits aside from mild osteoarthritis.  Surgical clips noted in the abdomen and pelvis.  IMPRESSION: No acute abnormality.  Mild bilateral hip osteoarthritis.  Original Report Authenticated By: Andreas Newport, M.D.   Ct Head Wo Contrast  03/26/2012  *RADIOLOGY REPORT*  Clinical  Data: 56 year old male with falls and weakness.  History of 2010 stroke.  CT HEAD WITHOUT CONTRAST  Technique:  Contiguous axial images were obtained from the base of the skull through the vertex without contrast.  Comparison: 11/21/2010 and earlier.  Findings: Visualized paranasal sinuses and mastoids are clear.  No acute osseous abnormality identified.  Visualized orbits and scalp soft tissues are within normal limits.  Calcified atherosclerosis at the skull base.  Chronic right MCA and PCA infarct.  Superimposed subacute appearing posterior right MCA infarct effecting the right frontal and parietal lobes at the vertex, new since prior.  Petechial hemorrhage (series 2 image 25). No significant mass effect.  No extra-axial hemorrhage.  Stable and normal gray-white matter differentiation in the left hemisphere and posterior fossa.  Stable ex vacuo changes to the ventricles.  Patent basilar cisterns. No suspicious intracranial vascular hyperdensity.  No intracranial mass lesion.  IMPRESSION: 1.  Subacute on chronic right MCA infarct. Associated petechial hemorrhage but no associated mass effect. 2.  Chronic right PCA infarct is stable.  Study discussed by telephone with Dr. Donnetta Hutching on 03/26/2012 at 1944 hours.  Original Report Authenticated By: Harley Hallmark, M.D.   Dg Chest Portable 1 View  03/26/2012  *RADIOLOGY REPORT*  Clinical Data: Weakness.  Swelling of the left foot.  PORTABLE CHEST - 1 VIEW  Comparison: 11/09/2010.  Findings: Unchanged AICD  lead, with lead extending into the region of the left subclavian vein.  Cardiopericardial silhouette is within normal limits and unchanged.  Lungs appear clear.  No airspace disease or effusion.  Probable old granulomatous calcifications in the left lung adjacent to the left heart border. Mild right basilar atelectasis is noted.  IMPRESSION:  1.  Stable support apparatus with AICD lead in the region of the left subclavian vein. 2.  No acute cardiopulmonary disease.   Original Report Authenticated By: Andreas Newport, M.D.      Medications:  Scheduled:   . buPROPion  150 mg Oral BID  . carvedilol  25 mg Oral QPM  . citalopram  20 mg Oral QPM  . clopidogrel  75 mg Oral Q breakfast  . insulin aspart  0-15 Units Subcutaneous TID WC  . insulin aspart  0-5 Units Subcutaneous QHS  . insulin glargine  60 Units Subcutaneous QHS  . lisinopril  10 mg Oral QPM  . mirtazapine  30 mg Oral QHS  . nicotine  21 mg Transdermal Daily  . polyethylene glycol  17 g Oral Daily  . simvastatin  40 mg Oral q1800  . DISCONTD: buPROPion  150 mg Oral BID  . DISCONTD: polyethylene glycol powder  17 g Oral Daily    Impression: 1. New CVA in the right MCA territory. 2. History of cerebrovascular disease with previous strokes. 3. Hypertension. 4. Coronary artery disease with cardiomyopathy and history of AICD. 5. Type 2 diabetes mellitus. 6. Obesity. 7. Tobacco and marijuana abuse. 8. History of noncompliance and abusive behavior towards medical staff in the past.     Plan: 1. Continue with current investigations and therapy. 2. Await physical therapy evaluation. I suspect that he will need skilled nursing facility for rehabilitation. He is very adamant that  he does not want to go to a nursing home. I've told him that he may not have an option. 3. Start Plavix. 4. Await neurology consultation.     LOS: 1 day   Wilson Singer Pager [phone]  03/27/2012, 8:27 AM

## 2012-03-27 NOTE — Consult Note (Signed)
NAME:  Peter Long, Peter Long NO.:  [id number]  MEDICAL RECORD NO.:  [id number]  LOCATION:  A334                          FACILITY:  [facility]  PHYSICIAN:  Elodia Haviland A. Gerilyn Pilgrim, M.D. DATE OF BIRTH:  1956/05/10  DATE OF CONSULTATION: DATE OF DISCHARGE:                                CONSULTATION   The patient is a 56 year old white male who has a baseline history of stroke dating back in 2010 where he presented with left-sided hemiparesis.  The CT scan at that time shows a hypodensity involving the right parietofrontal temporal area.  The patient apparently was injured at work prior to this.  He presented to the hospital a month after with recurrent symptoms and CT at that time including show any acute changes. Interestingly, however, I did review the scans in detail, and between his last scan in November 2010 to a scan done in 2011 and 2012, there is clearly another infarct seen involving the right PCA, not seen on his initial CT scan done in 2010.  The patient reports that he did have residual left-sided weakness, but was able to ambulate over the last week or so; however, he has had more of a global weakness really with multiple falls.  He fell a few times each day.  This resulted in the patient coming to the hospital for further evaluation.  He does not necessarily reports increased weakness on the left side.  I did question him several times about this.  More again, he reports of global weakness.  He does report some burning and numbness of the extremities suggestive of neuropathy.  He does have a history of diabetes.  The patient was supposed to be on Plavix or aspirin, but for some reason, he has not been taking or prescribed antiplatelet agents despite his history of stroke previously.  He reports that he has never been prescribed these, which seems hard to believe.  There seems to be a history of belligerent behavior while hospitalized, noncompliance.  He signed out AMA  once during his initial evaluation at [facility] in 2010. The patient does have a history of nonischemic cardiomyopathy and has AID in place, therefore an MRI cannot be done.  He is on multiple psychotropic medications.  It seemed the Lexapro has been held.  PHYSICAL EXAMINATION:  GENERAL:  Shows an obese man, in no acute distress. HEENT:  Neck is supple.  Head is normocephalic, atraumatic.  He does have a low lying riding uvula.  Large tongue, indicating risk of apnea. ABDOMEN:  Obese, but soft. EXTREMITIES:  Significant multiple small bruises involving the upper extremities and to some extent in lower extremities.  This is presumably from his falls. NEUROLOGIC:  Mentation, he is awake and alert.  Has some mild dysarthria, could be at baseline.  Speech is otherwise unremarkable. Language and cognition are fine.  Cranial nerve evaluation seemed to show mild incongruent left homonymous hemianopia.  He definitely extinguishes on double bilateral stimulation on the left visual fields. Extraocular movements are full.  Mild end-gaze nystagmus.  Pupils are 4 mm and briskly reactive to light.  Extraocular movements again are full. Facial muscle strength is symmetric.  Tongue is  midline.  Uvula midline. Shoulder shrug is decreased on the left side.  Motor examination shows significant pronator drift left upper extremity.  He has significant deltoid weakness, which he thinks is mostly due to rotator cuff tear and repair, it is graded at 3/5.  Hand grip 4+.  Right thigh  shows normal tone, bulk, and strength.  The left lower extremity proximal strength is 4, distal strength on dorsiflexion is 5.  Coordination does not reveal any dysmetria.  There are no tremors.  Reflexes are brisk on the left. Preserved on the right side.  Both plantar reflexes are extensor. Sensation is diminished in left lower extremity.  ASSESSMENT:  Acute infarct with CT scan showing right frontal, posterior frontal  hypodensity on CT.  Again, there are 2 older infarcts involving the right posterior MCA and the right PCA.  These seem to have occurred at separate times in the past based on review of serial CT scans.  Risk factors, hypertension, diabetes, dyslipidemia, and nicotine use.  The patient's scan shows small petechial hemorrhage, which is probably okay. Recommendations therefore are as follows.  Continue with antiplatelet agents.  He apparently for some reason does not want to be on Plavix. We will switch him to aspirin.  I would avoid Lovenox or other anticoagulation; however, he is on support stockings for deep venous thrombosis prophylaxis.  He has an echo and carotid pending, although labs are pending includes homocysteine level and RPR.  Thanks for this consultation.     Shannell Mikkelsen A. Gerilyn Pilgrim, M.D.     KAD/MEDQ  D:  03/27/2012  T:  03/27/2012  Job:  [id number]

## 2012-03-27 NOTE — Progress Notes (Signed)
UR Chart Review Completed  

## 2012-03-28 ENCOUNTER — Inpatient Hospital Stay (HOSPITAL_COMMUNITY): Payer: Medicare Other

## 2012-03-28 DIAGNOSIS — F1220 Cannabis dependence, uncomplicated: Secondary | ICD-10-CM

## 2012-03-28 DIAGNOSIS — I6330 Cerebral infarction due to thrombosis of unspecified cerebral artery: Secondary | ICD-10-CM

## 2012-03-28 DIAGNOSIS — F17200 Nicotine dependence, unspecified, uncomplicated: Secondary | ICD-10-CM

## 2012-03-28 DIAGNOSIS — E1049 Type 1 diabetes mellitus with other diabetic neurological complication: Secondary | ICD-10-CM

## 2012-03-28 LAB — GLUCOSE, CAPILLARY: Glucose-Capillary: 134 mg/dL — ABNORMAL HIGH (ref 70–99)

## 2012-03-28 MED ORDER — NICOTINE 21 MG/24HR TD PT24: 1.0000 | MEDICATED_PATCH | Freq: Every day | TRANSDERMAL | Status: AC

## 2012-03-28 MED ORDER — SODIUM CHLORIDE 0.9 % IJ SOLN
INTRAMUSCULAR | Status: AC
  Filled 2012-03-28: qty 3

## 2012-03-28 MED ORDER — ASPIRIN 325 MG PO TABS: 325.0000 mg | ORAL_TABLET | Freq: Every day | ORAL | Status: DC

## 2012-03-28 NOTE — Discharge Summary (Signed)
Physician Discharge Summary  Patient ID: Peter Long MRN: [id number] DOB/AGE: [birth year] 56 y.o. Primary Care Physician:MCGOUGH,WILLIAM M, MD, MD Admit date: 03/26/2012 Discharge date: 03/28/2012    Discharge Diagnoses:  1. 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 Previous history of CVA in this area. There was associated petechial hemorrhage, which was nonprogressive. 2. Hypertension. 3. Hyperlipidemia. 4. Type 2 diabetes mellitus. 5. Tobacco abuse. 6. Marijuana abuse. 7. Nonischemic cardiomyopathy, ejection fraction 35%. 8. Status post AICD. 9. Obesity.   Medication List  As of 03/28/2012  4:38 PM   STOP taking these medications         naproxen sodium 220 MG tablet         TAKE these medications         aspirin 325 MG tablet   Take 1 tablet (325 mg total) by mouth daily.      buPROPion 150 MG 12 hr tablet   Commonly known as: WELLBUTRIN SR   Take 150 mg by mouth 2 (two) times daily.      carvedilol 25 MG tablet   Commonly known as: COREG   Take 25 mg by mouth every evening.      citalopram 20 MG tablet   Commonly known as: CELEXA   Take 20 mg by mouth every evening.      escitalopram 10 MG tablet   Commonly known as: LEXAPRO   Take 10 mg by mouth every evening.      furosemide 20 MG tablet   Commonly known as: LASIX   Take 20 mg by mouth every evening. for edema      insulin glargine 100 UNIT/ML injection   Commonly known as: LANTUS   Inject 72 Units into the skin at bedtime.      lisinopril 10 MG tablet   Commonly known as: PRINIVIL,ZESTRIL   Take 10 mg by mouth every evening.      meloxicam 7.5 MG tablet   Commonly known as: MOBIC   Take 15 mg by mouth every evening.      mirtazapine 15 MG tablet   Commonly known as: REMERON   Take 30 mg by mouth at bedtime.      nicotine 21 mg/24hr patch   Commonly known as: NICODERM CQ - dosed in mg/24 hours   Place 1 patch onto the skin daily.      oxyCODONE 20 MG 12 hr tablet   Commonly known  as: OXYCONTIN   Take 20 mg by mouth every 12 (twelve) hours. **Take as needed for pain      polyethylene glycol powder powder   Commonly known as: GLYCOLAX/MIRALAX   Take 17 g by mouth daily.      simvastatin 40 MG tablet   Commonly known as: ZOCOR   Take 40 mg by mouth daily.            Discharged Condition: Stable.    Consults: Neurology, Dr Gerilyn Pilgrim.  Significant Diagnostic Studies: Ct Abdomen Pelvis Wo Contrast  03/16/2012  *RADIOLOGY REPORT*  Clinical Data: Chronic pain.  History of in the ventral abdominal wall hernia.  CT ABDOMEN AND PELVIS WITHOUT CONTRAST  Technique:  Multidetector CT imaging of the abdomen and pelvis was performed following the standard protocol without intravenous contrast.  Comparison: 10/04/2009  Findings:  The lung bases appear clear.  There is no pericardial or pleural effusion identified.  There are no focal liver abnormalities identified.  Prior cholecystectomy.  No biliary dilatation.  The pancreas appears within normal  limits.  Normal appearance of the spleen.  Both adrenal glands are normal.  Within the upper pole of the right kidney there is a nonobstructing calculus measuring 3.7 mm.  There is no right hydronephrosis or hydroureter.  Partially exophytic intermediate density structure arising from the posterior cortex 6 mm.  This is unchanged from previous exam.  There is no left-sided hydronephrosis or hydroureter.  The urinary bladder is normal.  No upper abdominal adenopathy identified.  There is no pelvic or inguinal adenopathy.  The urinary bladder appears normal.  The stomach appears normal.  The small bowel loops have a normal caliber without evidence for obstruction.  Complex ventral abdominal wall hernia is again noted. The supra umbilical component is identified on image 52 and contains fat only.  This is unchanged from previous exam.  The periumbilical and infraumbilical component contains multiple nonobstructed loops of small bowel.  This  appears increased in size when compared with 10/04/2009.  The colon has a normal course and caliber.  There is no free fluid or abnormal fluid collection identified.  Review of the visualized bony structures is significant for mild scoliosis and multilevel spondylosis.  IMPRESSION:  1.  Increasing size of complex ventral abdominal wall hernia with both the supra and infraumbilical components.  This contains nonobstructed loops of small bowel. 2.  No acute abdominal or pelvic CT findings.  Original Report Authenticated By: Rosealee Albee, M.D.   Dg Hip Complete Left  03/27/2012  *RADIOLOGY REPORT*  Clinical Data: Status post fall on left hip.  Left hip pain.  LEFT HIP - COMPLETE 2+ VIEW  Comparison: Left hip radiographs performed 03/26/2012  Findings: There is no evidence of fracture or dislocation.  Both femoral heads are seated normally within their respective acetabula.  The proximal left femur appears intact.  Mild superior joint space narrowing is noted at both hips.  The sacroiliac joints are unremarkable in appearance.  The visualized bowel gas pattern is grossly unremarkable in appearance.  Scattered postoperative change is noted about the lower abdomen and pelvis.  IMPRESSION: No evidence of fracture or dislocation.  Original Report Authenticated By: Tonia Ghent, M.D.   Dg Hip Complete Left  03/26/2012  *RADIOLOGY REPORT*  Clinical Data: Fall.  Left hip pain.  Left lower extremity swelling.  LEFT HIP - COMPLETE 2+ VIEW  Comparison: None.  Findings: Technically suboptimal study due to body habitus and technique.  No grossly displaced pelvic ring fractures or sacral fracture is identified.  Dedicated views of the left hip appear within normal limits aside from mild osteoarthritis.  Surgical clips noted in the abdomen and pelvis.  IMPRESSION: No acute abnormality.  Mild bilateral hip osteoarthritis.  Original Report Authenticated By: Andreas Newport, M.D.   Ct Head Wo Contrast  03/27/2012  *RADIOLOGY  REPORT*  Clinical Data: Larey Seat in bathroom.  Struck head on door.  CT HEAD WITHOUT CONTRAST  Technique:  Contiguous axial images were obtained from the base of the skull through the vertex without contrast.  Comparison: 03/26/2012  Findings: The no evidence for acute hemorrhage, hydrocephalus, mass lesion, or abnormal extra-axial fluid collection.  Old posterior right MCA and PCA infarcts are again noted.  Superimposed subacute paramidline high right parietal infarct is again noted.  There is some petechial hemorrhage at the periphery of this infarct, as before.  No evidence for progressive hemorrhage since yesterday's study.  Ex vacuo dilatation is seen in the posterior horn of the right lateral ventricle.  No evidence for evolving hydrocephalus.  The visualized paranasal sinuses and mastoid air cells are clear. No evidence for skull fracture.  IMPRESSION: Stable exam.  Acute on chronic right MCA territory infarcts with associated petechial hemorrhage. No new or progressive interval findings.  Original Report Authenticated By: ERIC A. MANSELL, M.D.   Ct Head Wo Contrast  03/26/2012  *RADIOLOGY REPORT*  Clinical Data: 56 year old male with falls and weakness.  History of 2010 stroke.  CT HEAD WITHOUT CONTRAST  Technique:  Contiguous axial images were obtained from the base of the skull through the vertex without contrast.  Comparison: 11/21/2010 and earlier.  Findings: Visualized paranasal sinuses and mastoids are clear.  No acute osseous abnormality identified.  Visualized orbits and scalp soft tissues are within normal limits.  Calcified atherosclerosis at the skull base.  Chronic right MCA and PCA infarct.  Superimposed subacute appearing posterior right MCA infarct effecting the right frontal and parietal lobes at the vertex, new since prior.  Petechial hemorrhage (series 2 image 25). No significant mass effect.  No extra-axial hemorrhage.  Stable and normal gray-white matter differentiation in the left  hemisphere and posterior fossa.  Stable ex vacuo changes to the ventricles.  Patent basilar cisterns. No suspicious intracranial vascular hyperdensity.  No intracranial mass lesion.  IMPRESSION: 1.  Subacute on chronic right MCA infarct. Associated petechial hemorrhage but no associated mass effect. 2.  Chronic right PCA infarct is stable.  Study discussed by telephone with Dr. Donnetta Hutching on 03/26/2012 at 1944 hours.  Original Report Authenticated By: Harley Hallmark, M.D.   US Carotid Duplex Bilateral  03/28/2012  *RADIOLOGY REPORT*  Clinical Data: Stroke  BILATERAL CAROTID DUPLEX ULTRASOUND  Technique: Gray scale imaging, color Doppler and duplex ultrasound was performed of bilateral carotid and vertebral arteries in the neck.  Comparison:  None.  Criteria:  Quantification of carotid stenosis is based on velocity parameters that correlate the residual internal carotid diameter with NASCET-based stenosis levels, using the diameter of the distal internal carotid lumen as the denominator for stenosis measurement.  The following velocity measurements were obtained:                   PEAK SYSTOLIC/END DIASTOLIC RIGHT ICA:                        75cm/sec CCA:                        75cm/sec SYSTOLIC ICA/CCA RATIO:     1.0 DIASTOLIC ICA/CCA RATIO: ECA:                        81cm/sec  LEFT ICA:                        16 nonecm/sec CCA:                        78cm/sec SYSTOLIC ICA/CCA RATIO:     0.89 DIASTOLIC ICA/CCA RATIO: ECA:                        75cm/sec  Findings:  RIGHT CAROTID ARTERY: Mild soft plaque in the bulb.  Low resistance internal carotid Doppler wave form.  RIGHT VERTEBRAL ARTERY:  Antegrade.  LEFT CAROTID ARTERY: Little if any plaque in the left bulb.  Low resistance internal carotid Doppler wave form.  LEFT VERTEBRAL ARTERY:  Antegrade.  IMPRESSION: Less than 50% stenosis in the right and left internal carotid arteries.  Original Report Authenticated By: Donavan Burnet, M.D.   Dg Chest Portable 1  View  03/26/2012  *RADIOLOGY REPORT*  Clinical Data: Weakness.  Swelling of the left foot.  PORTABLE CHEST - 1 VIEW  Comparison: 11/09/2010.  Findings: Unchanged AICD lead, with lead extending into the region of the left subclavian vein.  Cardiopericardial silhouette is within normal limits and unchanged.  Lungs appear clear.  No airspace disease or effusion.  Probable old granulomatous calcifications in the left lung adjacent to the left heart border. Mild right basilar atelectasis is noted.  IMPRESSION:  1.  Stable support apparatus with AICD lead in the region of the left subclavian vein. 2.  No acute cardiopulmonary disease.  Original Report Authenticated By: Andreas Newport, M.D.   *[facility]* [address] [place], [place] [address] [phone]  ------------------------------------------------------------ Transthoracic Echocardiography  Patient: Deone, Leifheit MR #: [id number] Study Date: 03/27/2012 Gender: M Age: 23 Height: 182.9cm Weight: 101.6kg BSA: 2.62m^2 Pt. Status: Room: A334  SONOGRAPHER Karrie Doffing ATTENDING Donnetta Hutching ADMITTING Yehuda Mao REFERRING Vania Rea PERFORMING Delma Freeze Penn cc:  ------------------------------------------------------------ LV EF: 35%  ------------------------------------------------------------ Indications: CVA 436.  ------------------------------------------------------------ History: PMH: Coronary artery disease. PMH: Hyperlipidemia, 2ndary CM Risk factors: Current tobacco use. Hypertension. Diabetes mellitus.  ------------------------------------------------------------ Study Conclusions  - Left ventricle: The cavity size was mildly dilated. Borderlline LVH. Systolic function was moderately reduced. The estimated ejection fraction was 35%. - Mitral valve: Calcified annulus. Impressions:  - Compared to the prior study performed 03/31/10, there has been no significant  interval change. Transthoracic echocardiography. M-mode, complete 2D, spectral Doppler, and color Doppler. Height: Height: 182.9cm. Height: 72in. Weight: Weight: 101.6kg. Weight: 223.5lb. Body mass index: BMI: 30.4kg/m^2. Body surface area: BSA: 2.2m^2. Patient status: Inpatient. Location: Bedside.      Lab Results: Basic Metabolic Panel:  [facility] 03/26/12 1822  NA 136  K 4.0  CL 98  CO2 29  GLUCOSE 185*  BUN 15  CREATININE 0.99  CALCIUM 9.4  MG --  PHOS --       CBC:  Basename 03/26/12 1822  WBC 16.5*  NEUTROABS 12.8*  HGB 15.9  HCT 45.8  MCV 87.7  PLT 155    Recent Results (from the past 240 hour(s))  MRSA PCR SCREENING     Status: Normal   Collection Time   03/26/12 10:49 PM      Component Value Range Status Comment   MRSA by PCR NEGATIVE  NEGATIVE  Final      Hospital Course: This 56 year old man presents to the hospital with symptoms of recurrent falls for the last 2-3 days. Patient has had a history of previous stroke with residual left hemiplegia. He has been falling as mentioned in the last few days. There were no speech difficulties. When he presented to the emergency room, CT brain scan confirmed the presence of an acute right MCA territory stroke in the same area as previous stroke. The patient has a history of tobacco and marijuana abuse, which is ongoing. He is known to be noncompliant with medications. Patient was evaluated for stroke workup. There was no evidence of significant carotid artery stenosis. Echocardiogram was unchanged with 35% ejection fraction and no evidence of thrombus. MRI could not be performed in view of his AICD. He received physical therapy and showed improvement in the last 24 hours. Physical therapy did recommend that he be  placed in a skilled nursing facility for physical therapy. The patient refuses to do this. At one time, the patient wanted to be transferred to [facility]. He was told that this was not appropriate and  there was no medical indication for this.  Discharge Exam: Blood pressure 129/72, pulse 75, temperature 98.2 F (36.8 C), temperature source Oral, resp. rate 20, height 6' (1.829 m), weight 113.2 kg (249 lb 9 oz), SpO2 90.00%. The patient is systemically well. He is alert and oriented. He is obese. Heart sounds are present and normal. Lung fields are clear. There is minimal focal deficits now affecting the left side. Lung fields are clear.  Disposition: Home. He will follow with his primary care physician.  Discharge Orders    Future Appointments: Provider: Department: Dept Phone: Center:   05/14/2012 11:15 AM Marinus Maw, MD [facility] [phone] [id number]     Future Orders Please Complete By Expires   Diet - low sodium heart healthy      Increase activity slowly           Signed: Wilson Singer Pager [phone]  03/28/2012, 4:38 PM

## 2012-03-28 NOTE — Progress Notes (Signed)
NAME:  Peter Long, LORD NO.:  [id number]  MEDICAL RECORD NO.:  [id number]  LOCATION:  A329                          FACILITY:  [facility]  PHYSICIAN:  Vikki Gains A. Gerilyn Pilgrim, M.D. DATE OF BIRTH:  February 03, 1956  DATE OF PROCEDURE: DATE OF DISCHARGE:                                PROGRESS NOTE   The patient fell out of bed apparently last night trying to reach for his monitor.  Otherwise,  no new complaints are reported.  He feels pretty good, now is moving extremities well and exercises.  They still have some issues about the patient, who wanted to be transferred to the hospital and also some issues regarding skilled facility.  He is not quite sure he wants to do this, although this is everyone's recommendation for the patient to recover.  Carotids were apparently not ordered.  Echo shows ejection of 35%.  Today, he is awake and alert.  He is lucid and coherent, has a mild dysarthria.  Again, that seems probably his baseline.  Facial muscle strength is symmetric.  He has full extraocular movements.  Strength on the left side shows 4+ proximally, both hip flexion and deltoids. Distal strength is actually is pretty good, it is 5/5 hand grip and dorsiflexion.  ASSESSMENT AND PLAN:  New stroke.  The patient is on aspirin.  He needs to modify risk factors, being compliant with therapy.  Again, we suggest skilled placement so the patient can get physical therapy.  He seemed to have some gait impairment.     Harish Bram A. Gerilyn Pilgrim, M.D.     KAD/MEDQ  D:  03/28/2012  T:  03/28/2012  Job:  [id number]

## 2012-03-28 NOTE — Progress Notes (Signed)
Physical Therapy Treatment Patient Details Name: Peter Long MRN: [id number] DOB: March 13, 1956 Today's Date: 03/28/2012 Time: 4098-1191 PT Time Calculation (min): 33 min  PT Assessment / Plan / Recommendation Comments on Treatment Session  Patient appears to have improved today with less flexor tone in LUE and less extensor tone in LLE; patient's sitting balance today was fair due to left lateral leaning he was not aware of but able to correct when given verbal/tactile cues. Patient was successful with hemi walker for gait trainging today; Min A for 16' All bed mobility Min A to MI    Follow Up Recommendations       Equipment Recommendations       Frequency     Plan      Precautions / Restrictions     Pertinent Vitals/Pain C/o of "tight and sore" LLE    Mobility  Bed Mobility Supine to Sit: 6: Modified independent (Device/Increase time) Sit to Supine: 6: Modified independent (Device/Increase time) Scooting to [facility]: 6: Modified independent (Device/Increase time);With trapeze;With rail Transfers Sit to Stand: 4: Min guard Stand to Sit: 4: Min guard Details for Transfer Assistance: Pt continues to be impulsive and required verbal cues for hand placement Ambulation/Gait Ambulation/Gait Assistance: 4: Min assist Ambulation Distance (Feet): 51 Feet Assistive device: Hemi-walker Ambulation/Gait Assistance Details: Pt needed verbal and tactile cues to stand straight forward to advance left hip rather than ambulate to the side with Right hip leading;verbal cues to keep hemi walker close with smaller steps Gait Pattern: Decreased stance time - left;Trunk rotated posteriorly on left;Decreased weight shift to left Stairs: No Wheelchair Mobility Wheelchair Mobility: No    Exercises General Exercises - Upper Extremity Elbow Extension: Left;AAROM;10 reps (with internal rotation to lessen flexor tone) General Exercises - Lower Extremity Ankle Circles/Pumps: Both;10 reps Quad Sets:  Both;10 reps Gluteal Sets: 10 reps Long Arc Quad: Both;10 reps Heel Slides: 5 reps;Both (heel to shin slides) Other Exercises Other Exercises: seated clamshells x10; bridges x10 Other Exercises: weight shifting in all directions to promote proprioception of LLE; 1 min; left trunk rotation 1 min Other Exercises: standing left hip flexion x10 to promote flexion for slight extensor tone   PT Goals Acute Rehab PT Goals PT Goal: Stand - Progress: Met PT Goal: Ambulate - Progress: Progressing toward goal  Visit Information  Last PT Received On: 03/28/12    Subjective Data      Cognition       Balance     End of Session PT - End of Session Equipment Utilized During Treatment: Gait belt Activity Tolerance: Patient tolerated treatment well Patient left: in bed;with call bell/phone within reach;with bed alarm set    Yanna Leaks ATKINSO 03/28/2012, 12:16 PM

## 2012-03-28 NOTE — Progress Notes (Signed)
I was called to Mr. Furry room at approximately 22:15 tonight.  When I arrived in the room, the patient was in the bed, but I was told that the patient had fallen in the bathroom and two techs had gotten him back in bed.  The patient stated that he was trying to pull up his pants, lost his balance and fell.  The patient had been told to pull the call bell when he was finished so he could be assisted back to bed and admitted to not doing so.  The bathroom call bell was checked and was working.  The patient had on green socks as the red socks were too tight on his feet.  He refused the gown with the pocket for the tele box.  He was given a pouch to wear around his neck to hold the tele, but immediately took it out and laid it back on the bed.  The patient was complaining of increased pain in his left hip.  Dr. Vania Rea, the on call hospitalist, was called immediately and a left hip x-ray was ordered.  The patient then complained about pain in his front left forehead area.  The doctor was called again and a CT of the head was ordered.  Both of these were negative for any type of injury.  The patient's wife was called immediately after the fall and advised of such.  She requested to be called after the testing was done and I informed the patient that I would call her back.  He stated that he was going to call her and I heard him do so.  The patient was given his regular medications, including Oxy IR 5 mg.  He had received this pain medication at 11:45, 18:30 as well.

## 2012-03-28 NOTE — Progress Notes (Signed)
Subjective: This man was admitted with falls. CT scan of his brain shows subacute on chronic right MCA CVA associated with petechial hemorrhage but no mass effect. Also, there is a chronic right PCA CVA which is stable. He was positive for cannabinoids in his urine drug screen. Yesterday evening he apparently had a fall, possibly with a head injury. There is no documentation on this. In any case, he underwent a repeat CT head scan late yesterday night. This showed a stable examination with acute on chronic right MCA territory infarcts with associated petechial hemorrhage. There was no new or progressive interval findings. This morning he is really unchanged. He desires to be transferred to [facility].           Physical Exam: Blood pressure 135/82, pulse 77, temperature 98 F (36.7 C), temperature source Oral, resp. rate 20, height 6' (1.829 m), weight 113.2 kg (249 lb 9 oz), SpO2 95.00%. He is obese. He appears to have left-sided weakness. His speech is normal. He is alert and orientated. Heart sounds are present and normal. Lung fields are clear.   Investigations:  Recent Results (from the past 240 hour(s))  MRSA PCR SCREENING     Status: Normal   Collection Time   03/26/12 10:49 PM      Component Value Range Status Comment   MRSA by PCR NEGATIVE  NEGATIVE  Final      Basic Metabolic Panel:  [facility] 03/26/12 1822  NA 136  K 4.0  CL 98  CO2 29  GLUCOSE 185*  BUN 15  CREATININE 0.99  CALCIUM 9.4  MG --  PHOS --       CBC:  Basename 03/26/12 1822  WBC 16.5*  NEUTROABS 12.8*  HGB 15.9  HCT 45.8  MCV 87.7  PLT 155    Dg Hip Complete Left  03/27/2012  *RADIOLOGY REPORT*  Clinical Data: Status post fall on left hip.  Left hip pain.  LEFT HIP - COMPLETE 2+ VIEW  Comparison: Left hip radiographs performed 03/26/2012  Findings: There is no evidence of fracture or dislocation.  Both femoral heads are seated normally within their respective acetabula.  The  proximal left femur appears intact.  Mild superior joint space narrowing is noted at both hips.  The sacroiliac joints are unremarkable in appearance.  The visualized bowel gas pattern is grossly unremarkable in appearance.  Scattered postoperative change is noted about the lower abdomen and pelvis.  IMPRESSION: No evidence of fracture or dislocation.  Original Report Authenticated By: Tonia Ghent, M.D.   Dg Hip Complete Left  03/26/2012  *RADIOLOGY REPORT*  Clinical Data: Fall.  Left hip pain.  Left lower extremity swelling.  LEFT HIP - COMPLETE 2+ VIEW  Comparison: None.  Findings: Technically suboptimal study due to body habitus and technique.  No grossly displaced pelvic ring fractures or sacral fracture is identified.  Dedicated views of the left hip appear within normal limits aside from mild osteoarthritis.  Surgical clips noted in the abdomen and pelvis.  IMPRESSION: No acute abnormality.  Mild bilateral hip osteoarthritis.  Original Report Authenticated By: Andreas Newport, M.D.   Ct Head Wo Contrast  03/27/2012  *RADIOLOGY REPORT*  Clinical Data: Larey Seat in bathroom.  Struck head on door.  CT HEAD WITHOUT CONTRAST  Technique:  Contiguous axial images were obtained from the base of the skull through the vertex without contrast.  Comparison: 03/26/2012  Findings: The no evidence for acute hemorrhage, hydrocephalus, mass lesion, or abnormal extra-axial fluid collection.  Old posterior right MCA and PCA infarcts are again noted.  Superimposed subacute paramidline high right parietal infarct is again noted.  There is some petechial hemorrhage at the periphery of this infarct, as before.  No evidence for progressive hemorrhage since yesterday's study.  Ex vacuo dilatation is seen in the posterior horn of the right lateral ventricle.  No evidence for evolving hydrocephalus.  The visualized paranasal sinuses and mastoid air cells are clear. No evidence for skull fracture.  IMPRESSION: Stable exam.  Acute on  chronic right MCA territory infarcts with associated petechial hemorrhage. No new or progressive interval findings.  Original Report Authenticated By: ERIC A. MANSELL, M.D.   Ct Head Wo Contrast  03/26/2012  *RADIOLOGY REPORT*  Clinical Data: 56 year old male with falls and weakness.  History of 2010 stroke.  CT HEAD WITHOUT CONTRAST  Technique:  Contiguous axial images were obtained from the base of the skull through the vertex without contrast.  Comparison: 11/21/2010 and earlier.  Findings: Visualized paranasal sinuses and mastoids are clear.  No acute osseous abnormality identified.  Visualized orbits and scalp soft tissues are within normal limits.  Calcified atherosclerosis at the skull base.  Chronic right MCA and PCA infarct.  Superimposed subacute appearing posterior right MCA infarct effecting the right frontal and parietal lobes at the vertex, new since prior.  Petechial hemorrhage (series 2 image 25). No significant mass effect.  No extra-axial hemorrhage.  Stable and normal gray-white matter differentiation in the left hemisphere and posterior fossa.  Stable ex vacuo changes to the ventricles.  Patent basilar cisterns. No suspicious intracranial vascular hyperdensity.  No intracranial mass lesion.  IMPRESSION: 1.  Subacute on chronic right MCA infarct. Associated petechial hemorrhage but no associated mass effect. 2.  Chronic right PCA infarct is stable.  Study discussed by telephone with Dr. Donnetta Hutching on 03/26/2012 at 1944 hours.  Original Report Authenticated By: Harley Hallmark, M.D.   Dg Chest Portable 1 View  03/26/2012  *RADIOLOGY REPORT*  Clinical Data: Weakness.  Swelling of the left foot.  PORTABLE CHEST - 1 VIEW  Comparison: 11/09/2010.  Findings: Unchanged AICD lead, with lead extending into the region of the left subclavian vein.  Cardiopericardial silhouette is within normal limits and unchanged.  Lungs appear clear.  No airspace disease or effusion.  Probable old granulomatous  calcifications in the left lung adjacent to the left heart border. Mild right basilar atelectasis is noted.  IMPRESSION:  1.  Stable support apparatus with AICD lead in the region of the left subclavian vein. 2.  No acute cardiopulmonary disease.  Original Report Authenticated By: Andreas Newport, M.D.   me  MRN   Description     JOSEGUADALUPE STAN  [id number]   55 year old Male              Result Narrative     *[facility]* [address] [place], [place] [address] [phone]  ------------------------------------------------------------ Transthoracic Echocardiography  Patient: Avyan, Livesay MR #: [id number] Study Date: 03/27/2012 Gender: M Age: 52 Height: 182.9cm Weight: 101.6kg BSA: 2.39m^2 Pt. Status: Room: A334  SONOGRAPHER Karrie Doffing ATTENDING Donnetta Hutching ADMITTING Yehuda Mao REFERRING Vania Rea PERFORMING Delma Freeze Penn cc:  ------------------------------------------------------------ LV EF: 35%  ------------------------------------------------------------ Indications: CVA 436.  ------------------------------------------------------------ History: PMH: Coronary artery disease. PMH: Hyperlipidemia, 2ndary CM Risk factors: Current tobacco use. Hypertension. Diabetes mellitus.  ------------------------------------------------------------ Study Conclusions  - Left ventricle: The cavity size was mildly dilated. Borderlline LVH. Systolic function was  moderately reduced. The estimated ejection fraction was 35%. - Mitral valve: Calcified annulus. Impressions:  - Compared to the prior study performed 03/31/10, there has been no significant interval change. Transthoracic echocardiography. M-mode, complete 2D, spectral Doppler, and color Doppler. Height: Height: 182.9cm. Height: 72in. Weight: Weight: 101.6kg. Weight: 223.5lb. Body mass index: BMI: 30.4kg/m^2. Body surface area: BSA: 2.44m^2. Patient  status: Inpatient. Location: Bedside.        Medications:  Scheduled:    . aspirin  325 mg Oral Daily  . buPROPion  150 mg Oral BID  . carvedilol  25 mg Oral QPM  . citalopram  20 mg Oral QPM  . insulin aspart  0-15 Units Subcutaneous TID WC  . insulin aspart  0-5 Units Subcutaneous QHS  . insulin glargine  60 Units Subcutaneous QHS  . lisinopril  10 mg Oral QPM  . mirtazapine  30 mg Oral QHS  . nicotine  21 mg Transdermal Daily  . polyethylene glycol  17 g Oral Daily  . simvastatin  40 mg Oral q1800  . sodium chloride        Impression: 1. New CVA in the right MCA territory. 2. History of cerebrovascular disease with previous strokes. 3. Hypertension. 4. Coronary artery disease with nonischemic cardiomyopathy, ejection fraction 35% and history of AICD. 5. Type 2 diabetes mellitus. 6. Obesity. 7. Tobacco and marijuana abuse. 8. History of noncompliance and abusive behavior towards medical staff in the past.     Plan: 1. Continue aspirin. 2. Await carotid Dopplers. 3. The patient and the wife want transfer to [facility]. I've told him that there is no indication for transfer. Have also told the patient that I feel that he is unsafe to go home due to his falls and also recommendation from physical therapy is for skilled nursing facility placement for rehabilitation. The patient and his wife disagree with this. They insist on transfer to [facility]. I've repeated there is no indication for this and I will not do this as services for his medical condition can be provided at this hospital. I've already discussed with the patient's wife the risk factors for his stroke and strategies to reduce his risk, including compliance with medications, healthier diet resulting in weight reduction and cessation of marijuana and tobacco abuse.     LOS: 2 days   Wilson Singer Pager [phone]  03/28/2012, 10:18 AM

## 2012-03-28 NOTE — Progress Notes (Signed)
Subjective: Interval History:   Objective: Vital signs in last 24 hours: Temp:  [97.6 F (36.4 C)-98.4 F (36.9 C)] 98 F (36.7 C) (05/08 0449) Pulse Rate:  [75-78] 77  (05/08 0449) Resp:  [14-20] 20  (05/08 0449) BP: (130-157)/(78-95) 135/82 mmHg (05/08 0449) SpO2:  [92 %-95 %] 95 % (05/08 0449) Weight:  [113.2 kg (249 lb 9 oz)] 113.2 kg (249 lb 9 oz) (05/08 0500)  Intake/Output from previous day: 05/07 0701 - 05/08 0700 In: 600 [P.O.:600] Out: 3 [Urine:3] Intake/Output this shift:   Nutritional status: Carb Control    Lab Results:  [facility] 03/26/12 1822  WBC 16.5*  HGB 15.9  HCT 45.8  PLT 155  NA 136  K 4.0  CL 98  CO2 29  GLUCOSE 185*  BUN 15  CREATININE 0.99  CALCIUM 9.4  LABA1C --   Lipid Panel  Basename 03/27/12 0603  CHOL 167  TRIG 242*  HDL 33*  CHOLHDL 5.1  VLDL 48*  LDLCALC 86    Studies/Results: Dg Hip Complete Left  03/27/2012  *RADIOLOGY REPORT*  Clinical Data: Status post fall on left hip.  Left hip pain.  LEFT HIP - COMPLETE 2+ VIEW  Comparison: Left hip radiographs performed 03/26/2012  Findings: There is no evidence of fracture or dislocation.  Both femoral heads are seated normally within their respective acetabula.  The proximal left femur appears intact.  Mild superior joint space narrowing is noted at both hips.  The sacroiliac joints are unremarkable in appearance.  The visualized bowel gas pattern is grossly unremarkable in appearance.  Scattered postoperative change is noted about the lower abdomen and pelvis.  IMPRESSION: No evidence of fracture or dislocation.  Original Report Authenticated By: Tonia Ghent, M.D.   Dg Hip Complete Left  03/26/2012  *RADIOLOGY REPORT*  Clinical Data: Fall.  Left hip pain.  Left lower extremity swelling.  LEFT HIP - COMPLETE 2+ VIEW  Comparison: None.  Findings: Technically suboptimal study due to body habitus and technique.  No grossly displaced pelvic ring fractures or sacral fracture is identified.   Dedicated views of the left hip appear within normal limits aside from mild osteoarthritis.  Surgical clips noted in the abdomen and pelvis.  IMPRESSION: No acute abnormality.  Mild bilateral hip osteoarthritis.  Original Report Authenticated By: Andreas Newport, M.D.   Ct Head Wo Contrast  03/27/2012  *RADIOLOGY REPORT*  Clinical Data: Larey Seat in bathroom.  Struck head on door.  CT HEAD WITHOUT CONTRAST  Technique:  Contiguous axial images were obtained from the base of the skull through the vertex without contrast.  Comparison: 03/26/2012  Findings: The no evidence for acute hemorrhage, hydrocephalus, mass lesion, or abnormal extra-axial fluid collection.  Old posterior right MCA and PCA infarcts are again noted.  Superimposed subacute paramidline high right parietal infarct is again noted.  There is some petechial hemorrhage at the periphery of this infarct, as before.  No evidence for progressive hemorrhage since yesterday's study.  Ex vacuo dilatation is seen in the posterior horn of the right lateral ventricle.  No evidence for evolving hydrocephalus.  The visualized paranasal sinuses and mastoid air cells are clear. No evidence for skull fracture.  IMPRESSION: Stable exam.  Acute on chronic right MCA territory infarcts with associated petechial hemorrhage. No new or progressive interval findings.  Original Report Authenticated By: ERIC A. MANSELL, M.D.   Ct Head Wo Contrast  03/26/2012  *RADIOLOGY REPORT*  Clinical Data: 56 year old male with falls and weakness.  History of 2010  stroke.  CT HEAD WITHOUT CONTRAST  Technique:  Contiguous axial images were obtained from the base of the skull through the vertex without contrast.  Comparison: 11/21/2010 and earlier.  Findings: Visualized paranasal sinuses and mastoids are clear.  No acute osseous abnormality identified.  Visualized orbits and scalp soft tissues are within normal limits.  Calcified atherosclerosis at the skull base.  Chronic right MCA and PCA  infarct.  Superimposed subacute appearing posterior right MCA infarct effecting the right frontal and parietal lobes at the vertex, new since prior.  Petechial hemorrhage (series 2 image 25). No significant mass effect.  No extra-axial hemorrhage.  Stable and normal gray-white matter differentiation in the left hemisphere and posterior fossa.  Stable ex vacuo changes to the ventricles.  Patent basilar cisterns. No suspicious intracranial vascular hyperdensity.  No intracranial mass lesion.  IMPRESSION: 1.  Subacute on chronic right MCA infarct. Associated petechial hemorrhage but no associated mass effect. 2.  Chronic right PCA infarct is stable.  Study discussed by telephone with Dr. Donnetta Hutching on 03/26/2012 at 1944 hours.  Original Report Authenticated By: Harley Hallmark, M.D.   Dg Chest Portable 1 View  03/26/2012  *RADIOLOGY REPORT*  Clinical Data: Weakness.  Swelling of the left foot.  PORTABLE CHEST - 1 VIEW  Comparison: 11/09/2010.  Findings: Unchanged AICD lead, with lead extending into the region of the left subclavian vein.  Cardiopericardial silhouette is within normal limits and unchanged.  Lungs appear clear.  No airspace disease or effusion.  Probable old granulomatous calcifications in the left lung adjacent to the left heart border. Mild right basilar atelectasis is noted.  IMPRESSION:  1.  Stable support apparatus with AICD lead in the region of the left subclavian vein. 2.  No acute cardiopulmonary disease.  Original Report Authenticated By: Andreas Newport, M.D.    Medications:  Scheduled Meds:   . aspirin  325 mg Oral Daily  . buPROPion  150 mg Oral BID  . carvedilol  25 mg Oral QPM  . citalopram  20 mg Oral QPM  . insulin aspart  0-15 Units Subcutaneous TID WC  . insulin aspart  0-5 Units Subcutaneous QHS  . insulin glargine  60 Units Subcutaneous QHS  . lisinopril  10 mg Oral QPM  . mirtazapine  30 mg Oral QHS  . nicotine  21 mg Transdermal Daily  . polyethylene glycol  17 g  Oral Daily  . simvastatin  40 mg Oral q1800  . sodium chloride       Continuous Infusions:   . 0.9 % NaCl with KCl 20 mEq / L 1,000 mL (03/28/12 0231)   PRN Meds:.acetaminophen, acetaminophen, ondansetron (ZOFRAN) IV, oxyCODONE, senna-docusate   Assessment/Plan: See dict   LOS: 2 days   Francie Keeling

## 2012-03-28 NOTE — Progress Notes (Signed)
The patient slept well throughout the night without needing any further pain medicine.  His bed alarm was on, as it had been previously.  He stated that he felt well and had no complaints of pain.  The patient had been moved to room 329.  The fall occurred in room 334.  He was moved as 329 is closer to the nursing station and is a camera room since the patient had sustained a fall, without injury.  He was not bruised where he stated he had hit and again, stated that he slept well and complained of no pain.

## 2012-03-29 NOTE — Care Management Note (Signed)
    Page 1 of 1   03/29/2012     11:44:20 AM   CARE MANAGEMENT NOTE 03/29/2012  Patient:  Peter Long, Peter Long   Account Number:  [id number]  Date Initiated:  03/28/2012  Documentation initiated by:  Rosemary Holms  Subjective/Objective Assessment:   Pt admitted from home with spouse. Diagnosed with a new CVA and has had several falls. PT has recommended SNF but Pt refuses and wishes to be referred to another acute facility.     Action/Plan:   CM spoke to pt and wife at bedside. They are planning to DC home. State they have the DME needed but would like to have [facility] RN and PT. They state they previously had [facility] and would like them once again.   Anticipated DC Date:  03/28/2012   Anticipated DC Plan:  HOME W HOME HEALTH SERVICES      DC Planning Services  CM consult      Choice offered to / List presented to:          [facility] arranged  HH-1 RN  HH-10 DISEASE MANAGEMENT  HH-2 PT      HH agency  [facility]   Status of service:  Completed, signed off Medicare Important Message given?   (If response is "NO", the following Medicare IM given date fields will be blank) Date Medicare IM given:   Date Additional Medicare IM given:    Discharge Disposition:  HOME W HOME HEALTH SERVICES  Per UR Regulation:    If discussed at Long Length of Stay Meetings, dates discussed:    Comments:  03/29/12 1130 Ahmiya Abee Leanord Hawking RN BSN CM Discharged late yesterday. Contacted [facility] today and they will send out RN tomorrow.  03/28/12 1100 Carlene Bickley RN BNS CM Contacted Alroy Bailiff with [facility] [place] who will speak to pt. RN will go out the day after DC and the PT will not be able to go out for 5-7 days. This was explained by [facility] representative and CM. PT and wife are aware. Dr. Karilyn Cota notified of [facility] arrangements.

## 2012-05-14 ENCOUNTER — Encounter: Payer: MEDICARE | Admitting: Internal Medicine

## 2012-06-21 ENCOUNTER — Ambulatory Visit (INDEPENDENT_AMBULATORY_CARE_PROVIDER_SITE_OTHER): Payer: Medicare Other | Admitting: Internal Medicine

## 2012-06-21 ENCOUNTER — Encounter: Payer: Self-pay | Admitting: Internal Medicine

## 2012-06-21 VITALS — BP 133/76 | HR 82 | Ht 72.0 in | Wt 241.8 lb

## 2012-06-21 DIAGNOSIS — I10 Essential (primary) hypertension: Secondary | ICD-10-CM

## 2012-06-21 DIAGNOSIS — Z95810 Presence of automatic (implantable) cardiac defibrillator: Secondary | ICD-10-CM

## 2012-06-21 DIAGNOSIS — I429 Cardiomyopathy, unspecified: Secondary | ICD-10-CM

## 2012-06-21 LAB — ICD DEVICE OBSERVATION
BATTERY VOLTAGE: 2.55 V
BRDY-0002RV: 40 {beats}/min
CHARGE TIME: 12.4 s
DEV-0020ICD: NEGATIVE
DEVICE MODEL ICD: 255342
RV LEAD AMPLITUDE: 9.9 mv
RV LEAD IMPEDENCE ICD: 445 Ohm
RV LEAD THRESHOLD: 0.75 V
TOT-0006: 20130315000000
TOT-0007: 2
TOT-0008: 0
TOT-0009: 1
TOT-0010: 58
VENTRICULAR PACING ICD: 1 pct

## 2012-06-21 NOTE — Patient Instructions (Addendum)
Your physician recommends that you schedule a follow-up appointment in: 3 months device check  Your physician wants you to follow-up in: 1 year with Dr Ladona Ridgel.   You will receive a reminder letter in the mail two months in advance. If you don't receive a letter, please call our office to schedule the follow-up appointment.

## 2012-06-21 NOTE — Assessment & Plan Note (Signed)
His St. Jude ICD is approaching but not yet at elective replacement. We'll plan to recheck in several months.

## 2012-06-21 NOTE — Progress Notes (Signed)
HPI [name] returns today for followup. He is a pleasant middle-age man with a long-standing cardiomyopathy, chronic systolic heart failure, status post ICD implantation. He is sustained another stroke. The etiology is unclear. He has reduced his tobacco use over the last several months such that he is almost completely stopped. He denies any recent ICD shocks. He is approaching but not yet at elective replacement on his device. Allergies  Allergen Reactions  . Morphine      Current Outpatient Prescriptions  Medication Sig Dispense Refill  . ARIPiprazole (ABILIFY) 10 MG tablet Take 10 mg by mouth daily.      Marland Kitchen aspirin 325 MG tablet Take 1 tablet (325 mg total) by mouth daily.  30 tablet  0  . atorvastatin (LIPITOR) 80 MG tablet Take 80 mg by mouth daily.      Marland Kitchen buPROPion (WELLBUTRIN SR) 150 MG 12 hr tablet Take 150 mg by mouth 2 (two) times daily.        . carvedilol (COREG) 25 MG tablet Take 25 mg by mouth 2 (two) times daily with a meal.       . citalopram (CELEXA) 40 MG tablet Take 40 mg by mouth daily.      Marland Kitchen escitalopram (LEXAPRO) 10 MG tablet Take 10 mg by mouth every evening.       . furosemide (LASIX) 20 MG tablet Take 20 mg by mouth every evening. 1-2 tabs daily for edema      . insulin glargine (LANTUS) 100 UNIT/ML injection Inject 72 Units into the skin at bedtime.       Marland Kitchen lisinopril (PRINIVIL,ZESTRIL) 10 MG tablet Take 10 mg by mouth every evening.       . meloxicam (MOBIC) 7.5 MG tablet Take 15 mg by mouth every evening.       . mirtazapine (REMERON) 15 MG tablet Take 30 mg by mouth at bedtime.       Marland Kitchen oxyCODONE (OXYCONTIN) 20 MG 12 hr tablet Take 20 mg by mouth every 12 (twelve) hours. **Take as needed for pain      . senna (SENOKOT) 8.6 MG tablet Take 1 tablet by mouth as needed.      . simvastatin (ZOCOR) 40 MG tablet Take 40 mg by mouth daily.        Marland Kitchen DISCONTD: digoxin (LANOXIN) 0.125 MG tablet Take 125 mcg by mouth daily.           Past Medical History  Diagnosis  Date  . Type 2 diabetes mellitus   . Hyperlipidemia   . Essential hypertension, benign   . Coronary artery disease     Possible - poorly documented  . Chronic pain   . Diverticulitis   . COPD (chronic obstructive pulmonary disease)   . Anxiety   . Depression   . Cerebrovascular disease   . Nonischemic cardiomyopathy     LVEF 30-35% 5/11  . MRSA (methicillin resistant Staphylococcus aureus) septicemia   . Diabetes mellitus     ROS:   All systems reviewed and negative except as noted in the HPI.   Past Surgical History  Procedure Date  . Right gluteal abscess drainage 2005  . Abdominal surgery 2004  . Cardiac defibrillator placement 2005    [facility] - Dr. Severiano Gilbert, ST. Jude Atlas Plus VR  . Ercp with stone extraction 2010  . Arthroscopic left shoulder surgery 2011     Family History  Problem Relation Age of Onset  . Heart disease    . Cancer    .  Diabetes       History   Social History  . Marital Status: Married    Spouse Name: N/A    Number of Children: N/A  . Years of Education: N/A   Occupational History  . Not on file.   Social History Main Topics  . Smoking status: Current Everyday Smoker -- 1.5 packs/day for 30 years    Types: Cigarettes  . Smokeless tobacco: Never Used  . Alcohol Use: No  . Drug Use: No  . Sexually Active: Not on file   Other Topics Concern  . Not on file   Social History Narrative  . No narrative on file     BP 133/76  Pulse 82  Ht 6' (1.829 m)  Wt 241 lb 12.8 oz (109.68 kg)  BMI 32.79 kg/m2  Physical Exam:  Chronically ill appearing NAD HEENT: Unremarkable Neck:  No JVD, no thyromegally Lungs:  Clear with no wheezes, rales, or rhonchi. HEART:  Regular rate rhythm, no murmurs, no rubs, no clicks Abd:  soft, positive bowel sounds, no organomegally, no rebound, no guarding Ext:  2 plus pulses, no edema, no cyanosis, no clubbing Skin:  No rashes no nodules Neuro:  CN II through XII intact, left  hemiparesthesias  DEVICE  Normal device function.  See PaceArt for details.   Assess/Plan:

## 2012-06-21 NOTE — Assessment & Plan Note (Signed)
His blood pressure is well controlled. I've encouraged him to continue his current medical therapy and maintain a low-sodium diet.

## 2012-06-25 ENCOUNTER — Encounter: Payer: Self-pay | Admitting: Internal Medicine

## 2012-08-30 ENCOUNTER — Ambulatory Visit (HOSPITAL_COMMUNITY): Payer: Medicare Other | Admitting: Specialist

## 2012-09-26 ENCOUNTER — Encounter: Payer: Self-pay | Admitting: *Deleted

## 2012-11-11 ENCOUNTER — Emergency Department (HOSPITAL_COMMUNITY): Payer: Medicare Other

## 2012-11-11 ENCOUNTER — Emergency Department (HOSPITAL_COMMUNITY)
Admission: EM | Admit: 2012-11-11 | Discharge: 2012-11-11 | Disposition: A | Discharge status: Home / Self Care | Payer: Medicare Other | Attending: Emergency Medicine | Admitting: Emergency Medicine

## 2012-11-11 ENCOUNTER — Encounter (HOSPITAL_COMMUNITY): Payer: Self-pay | Admitting: Emergency Medicine

## 2012-11-11 DIAGNOSIS — F3289 Other specified depressive episodes: Secondary | ICD-10-CM | POA: Insufficient documentation

## 2012-11-11 DIAGNOSIS — R05 Cough: Secondary | ICD-10-CM | POA: Insufficient documentation

## 2012-11-11 DIAGNOSIS — Z8614 Personal history of Methicillin resistant Staphylococcus aureus infection: Secondary | ICD-10-CM | POA: Insufficient documentation

## 2012-11-11 DIAGNOSIS — Z79899 Other long term (current) drug therapy: Secondary | ICD-10-CM | POA: Insufficient documentation

## 2012-11-11 DIAGNOSIS — R059 Cough, unspecified: Secondary | ICD-10-CM | POA: Insufficient documentation

## 2012-11-11 DIAGNOSIS — F411 Generalized anxiety disorder: Secondary | ICD-10-CM | POA: Insufficient documentation

## 2012-11-11 DIAGNOSIS — F17200 Nicotine dependence, unspecified, uncomplicated: Secondary | ICD-10-CM | POA: Insufficient documentation

## 2012-11-11 DIAGNOSIS — Z95810 Presence of automatic (implantable) cardiac defibrillator: Secondary | ICD-10-CM | POA: Insufficient documentation

## 2012-11-11 DIAGNOSIS — J4489 Other specified chronic obstructive pulmonary disease: Secondary | ICD-10-CM | POA: Insufficient documentation

## 2012-11-11 DIAGNOSIS — Z7982 Long term (current) use of aspirin: Secondary | ICD-10-CM | POA: Insufficient documentation

## 2012-11-11 DIAGNOSIS — J45901 Unspecified asthma with (acute) exacerbation: Secondary | ICD-10-CM | POA: Insufficient documentation

## 2012-11-11 DIAGNOSIS — I2510 Atherosclerotic heart disease of native coronary artery without angina pectoris: Secondary | ICD-10-CM | POA: Insufficient documentation

## 2012-11-11 DIAGNOSIS — J449 Chronic obstructive pulmonary disease, unspecified: Secondary | ICD-10-CM | POA: Insufficient documentation

## 2012-11-11 DIAGNOSIS — F329 Major depressive disorder, single episode, unspecified: Secondary | ICD-10-CM | POA: Insufficient documentation

## 2012-11-11 DIAGNOSIS — I509 Heart failure, unspecified: Secondary | ICD-10-CM | POA: Insufficient documentation

## 2012-11-11 DIAGNOSIS — J441 Chronic obstructive pulmonary disease with (acute) exacerbation: Secondary | ICD-10-CM | POA: Insufficient documentation

## 2012-11-11 DIAGNOSIS — K5732 Diverticulitis of large intestine without perforation or abscess without bleeding: Secondary | ICD-10-CM | POA: Insufficient documentation

## 2012-11-11 DIAGNOSIS — I10 Essential (primary) hypertension: Secondary | ICD-10-CM | POA: Insufficient documentation

## 2012-11-11 DIAGNOSIS — I428 Other cardiomyopathies: Secondary | ICD-10-CM | POA: Insufficient documentation

## 2012-11-11 DIAGNOSIS — E119 Type 2 diabetes mellitus without complications: Secondary | ICD-10-CM | POA: Insufficient documentation

## 2012-11-11 DIAGNOSIS — M129 Arthropathy, unspecified: Secondary | ICD-10-CM | POA: Insufficient documentation

## 2012-11-11 DIAGNOSIS — I69959 Hemiplegia and hemiparesis following unspecified cerebrovascular disease affecting unspecified side: Secondary | ICD-10-CM | POA: Insufficient documentation

## 2012-11-11 DIAGNOSIS — Z794 Long term (current) use of insulin: Secondary | ICD-10-CM | POA: Insufficient documentation

## 2012-11-11 DIAGNOSIS — E785 Hyperlipidemia, unspecified: Secondary | ICD-10-CM | POA: Insufficient documentation

## 2012-11-11 DIAGNOSIS — G8929 Other chronic pain: Secondary | ICD-10-CM | POA: Insufficient documentation

## 2012-11-11 HISTORY — DX: Unspecified osteoarthritis, unspecified site: M19.90

## 2012-11-11 HISTORY — DX: Heart failure, unspecified: I50.9

## 2012-11-11 HISTORY — DX: Cerebral infarction, unspecified: I63.9

## 2012-11-11 LAB — BASIC METABOLIC PANEL
Calcium: 8.9 mg/dL (ref 8.4–10.5)
GFR calc non Af Amer: 80 mL/min — ABNORMAL LOW (ref >90)
Glucose, Bld: 134 mg/dL — ABNORMAL HIGH (ref 70–99)
Sodium: 134 mEq/L — ABNORMAL LOW (ref 135–145)

## 2012-11-11 LAB — CBC WITH DIFFERENTIAL/PLATELET
Basophils Absolute: 0 10*3/uL (ref 0.0–0.1)
Basophils Relative: 0 % (ref 0–1)
Eosinophils Absolute: 0.5 10*3/uL (ref 0.0–0.7)
Eosinophils Relative: 4 % (ref 0–5)
Lymphs Abs: 2 10*3/uL (ref 0.7–4.0)
MCH: 30 pg (ref 26.0–34.0)
MCHC: 35.4 g/dL (ref 30.0–36.0)
MCV: 84.7 fL (ref 78.0–100.0)
Platelets: 197 10*3/uL (ref 150–400)
RDW: 13.4 % (ref 11.5–15.5)

## 2012-11-11 LAB — GLUCOSE, CAPILLARY: Glucose-Capillary: 152 mg/dL — ABNORMAL HIGH (ref 70–99)

## 2012-11-11 MED ORDER — ALBUTEROL (5 MG/ML) CONTINUOUS INHALATION SOLN
15.0000 mg | INHALATION_SOLUTION | Freq: Once | RESPIRATORY_TRACT | Status: AC
  Administered 2012-11-11: 15 mg via RESPIRATORY_TRACT
  Filled 2012-11-11: qty 20

## 2012-11-11 MED ORDER — PREDNISONE 10 MG PO TABS
60.0000 mg | ORAL_TABLET | Freq: Once | ORAL | Status: AC
  Administered 2012-11-11: 60 mg via ORAL
  Filled 2012-11-11: qty 7
  Filled 2012-11-11: qty 1

## 2012-11-11 MED ORDER — IPRATROPIUM BROMIDE 0.02 % IN SOLN
1.0000 mg | Freq: Once | RESPIRATORY_TRACT | Status: AC
  Administered 2012-11-11: 1 mg via RESPIRATORY_TRACT
  Filled 2012-11-11: qty 5

## 2012-11-11 MED ORDER — DOXYCYCLINE HYCLATE 100 MG PO TABS: 100.0000 mg | ORAL_TABLET | Freq: Two times a day (BID) | ORAL | Status: DC

## 2012-11-11 MED ORDER — PREDNISONE 20 MG PO TABS: 40.0000 mg | ORAL_TABLET | Freq: Every day | ORAL | Status: DC

## 2012-11-11 MED ORDER — DOXYCYCLINE HYCLATE 100 MG PO TABS
100.0000 mg | ORAL_TABLET | Freq: Once | ORAL | Status: AC
  Administered 2012-11-11: 100 mg via ORAL
  Filled 2012-11-11: qty 1

## 2012-11-11 MED ORDER — ALBUTEROL SULFATE (2.5 MG/3ML) 0.083% IN NEBU: 2.5000 mg | INHALATION_SOLUTION | RESPIRATORY_TRACT | Status: DC | PRN

## 2012-11-11 NOTE — ED Notes (Signed)
Orders did not give option for Right side chest. Xray notified and will change order to dg chest unilateral right due to pain on pt's ri side.

## 2012-11-11 NOTE — ED Provider Notes (Signed)
History     CSN: [id number]  Arrival date & time 11/11/12  1145   First MD Initiated Contact with Patient 11/11/12 1312      Chief Complaint  Patient presents with  . Chest Pain     HPI Pt was seen at 1325.   Per pt, c/o gradual onset and persistence of constant right upper chest wall "pain" that began 5 days ago.  Pt states he "choked while eating too fast" and a medically trained family member gave him the Heimlich maneuver with resolution of the choking episode 5 days ago.  States since then he has had "pain around my defibrillator" site, coughing and "wheezing."  Endorses he ran out of solution for home neb machine.  Continues to smoke cigarettes. Denies fevers, no palpitations, no defibrillator discharge, no abd pain, no N/V/D, no back pain, no sore throat/dysphagia.     Past Medical History  Diagnosis Date  . Type 2 diabetes mellitus   . Hyperlipidemia   . Essential hypertension, benign   . Coronary artery disease     Possible - poorly documented  . Chronic pain   . Diverticulitis   . COPD (chronic obstructive pulmonary disease)   . Anxiety   . Depression   . Cerebrovascular disease   . Nonischemic cardiomyopathy     LVEF 30-35% 5/11  . MRSA (methicillin resistant Staphylococcus aureus) septicemia   . Diabetes mellitus   . Arthritis   . Stroke     residual left hemiplegia, left visual field deficit  . CHF (congestive heart failure)     Past Surgical History  Procedure Date  . Right gluteal abscess drainage 2005  . Abdominal surgery 2004  . Cardiac defibrillator placement 2005    [facility] - Dr. Severiano Gilbert, [employer] [facility]  . Ercp with stone extraction 2010  . Arthroscopic left shoulder surgery 2011    Family History  Problem Relation Age of Onset  . Heart disease    . Cancer    . Diabetes      History  Substance Use Topics  . Smoking status: Current Every Day Smoker -- 1.5 packs/day for 30 years    Types: Cigarettes  . Smokeless tobacco: Never Used   . Alcohol Use: No    Review of Systems ROS: Statement: All systems negative except as marked or noted in the HPI; Constitutional: Negative for fever and chills. ; ; Eyes: Negative for eye pain, redness and discharge. ; ; ENMT: Negative for ear pain, hoarseness, nasal congestion, sinus pressure and sore throat. ; ; Cardiovascular: Negative for palpitations, diaphoresis, dyspnea and peripheral edema. ; ; Respiratory: +cough, wheezing. Negative for stridor. ; ; Gastrointestinal: Negative for nausea, vomiting, diarrhea, abdominal pain, blood in stool, hematemesis, jaundice and rectal bleeding. . ; ; Genitourinary: Negative for dysuria, flank pain and hematuria. ; ; Musculoskeletal: +chest wall pain. Negative for back pain and neck pain. Negative for swelling and trauma.; ; Skin: Negative for pruritus, rash, abrasions, blisters, bruising and skin lesion.; ; Neuro: Negative for headache, lightheadedness and neck stiffness. Negative for weakness, altered level of consciousness , altered mental status, extremity weakness, paresthesias, involuntary movement, seizure and syncope.       Allergies  Morphine  Home Medications   Current Outpatient Rx  Name  Route  Sig  Dispense  Refill  . ASPIRIN EC 81 MG PO TBEC   Oral   Take 81 mg by mouth daily.         Marland Kitchen CARVEDILOL  25 MG PO TABS   Oral   Take 25 mg by mouth 2 (two) times daily with a meal.          . CIPROFLOXACIN HCL 500 MG PO TABS   Oral   Take 500 mg by mouth 2 (two) times daily.         Marland Kitchen CITALOPRAM HYDROBROMIDE 20 MG PO TABS   Oral   Take 20 mg by mouth daily.         . FUROSEMIDE 20 MG PO TABS   Oral   Take 20 mg by mouth every evening.          . INSULIN GLARGINE 100 UNIT/ML [place] SOLN   Subcutaneous   Inject 72 Units into the skin at bedtime.          Marland Kitchen LISINOPRIL 10 MG PO TABS   Oral   Take 10 mg by mouth every evening.          Marland Kitchen METRONIDAZOLE 500 MG PO TABS   Oral   Take 500 mg by mouth 2 (two) times  daily.         Marland Kitchen MIRTAZAPINE 30 MG PO TABS   Oral   Take 30 mg by mouth at bedtime.         . OXYCODONE HCL ER 20 MG PO TB12   Oral   Take 20 mg by mouth every 12 (twelve) hours. **Take as needed for pain           BP 131/68  Pulse 84  Temp 99.4 F (37.4 C) (Oral)  Resp 16  Ht 6' (1.829 m)  Wt 252 lb (114.306 kg)  BMI 34.18 kg/m2  SpO2 100%  Physical Exam 1330: Physical examination:  Nursing notes reviewed; Vital signs and O2 SAT reviewed;  Constitutional: Well developed, Well nourished, Well hydrated, In no acute distress; Head:  Normocephalic, atraumatic; Eyes: EOMI, PERRL, No scleral icterus; ENMT: Mouth and pharynx normal, Mucous membranes moist; Neck: Supple, Full range of motion, No lymphadenopathy; Cardiovascular: Regular rate and rhythm, No gallop; Respiratory: Breath sounds coarse & equal bilaterally, faint scattered wheezes.  Speaking full sentences with ease, Normal respiratory effort/excursion; Chest: +AICD site mildly tender to palp without overlying erythema, edema or ecchymosis. No open wounds. Movement normal; Abdomen: Soft, Nontender, Nondistended, Normal bowel sounds;; Extremities: Pulses normal, No tenderness, No edema, No calf edema or asymmetry.; Neuro: AA&Ox3, Major CN grossly intact.  Speech clear. +mild left sided hemiparesis per hx, otherwise no new gross focal motor deficits in extremities.; Skin: Color normal, Warm, Dry.   ED Course  Procedures     MDM  MDM Reviewed: nursing note, vitals and previous chart Reviewed previous: labs and ECG Interpretation: labs, ECG and x-ray    Date: 11/11/2012  Rate: 79  Rhythm: normal sinus rhythm  QRS Axis: normal  Intervals: normal  ST/T Wave abnormalities: normal  Conduction Disutrbances:none  Narrative Interpretation:   Old EKG Reviewed: unchanged; no significant changes from previous EKG dated 09/26/2009.   Results for orders placed during the hospital encounter of 11/11/12  GLUCOSE, CAPILLARY       Component Value Range   Glucose-Capillary 152 (*) 70 - 99 mg/dL  PRO B NATRIURETIC PEPTIDE      Component Value Range   Pro B Natriuretic peptide (BNP) 210.4 (*) 0 - 125 pg/mL  TROPONIN I      Component Value Range   Troponin I <0.30  <0.30 ng/mL  CBC WITH DIFFERENTIAL  Component Value Range   WBC 12.5 (*) 4.0 - 10.5 K/uL   RBC 4.70  4.22 - 5.81 MIL/uL   Hemoglobin 14.1  13.0 - 17.0 g/dL   HCT 53.6  64.4 - 03.4 %   MCV 84.7  78.0 - 100.0 fL   MCH 30.0  26.0 - 34.0 pg   MCHC 35.4  30.0 - 36.0 g/dL   RDW 74.2  59.5 - 63.8 %   Platelets 197  150 - 400 K/uL   Neutrophils Relative 68  43 - 77 %   Neutro Abs 8.5 (*) 1.7 - 7.7 K/uL   Lymphocytes Relative 16  12 - 46 %   Lymphs Abs 2.0  0.7 - 4.0 K/uL   Monocytes Relative 12  3 - 12 %   Monocytes Absolute 1.5 (*) 0.1 - 1.0 K/uL   Eosinophils Relative 4  0 - 5 %   Eosinophils Absolute 0.5  0.0 - 0.7 K/uL   Basophils Relative 0  0 - 1 %   Basophils Absolute 0.0  0.0 - 0.1 K/uL  BASIC METABOLIC PANEL      Component Value Range   Sodium 134 (*) 135 - 145 mEq/L   Potassium 3.8  3.5 - 5.1 mEq/L   Chloride 98  96 - 112 mEq/L   CO2 28  19 - 32 mEq/L   Glucose, Bld 134 (*) 70 - 99 mg/dL   BUN 13  6 - 23 mg/dL   Creatinine, Ser 7.56  0.50 - 1.35 mg/dL   Calcium 8.9  8.4 - 43.3 mg/dL   GFR calc non Af Amer 80 (*) >90 mL/min   GFR calc Af Amer >90  >90 mL/min   Dg Ribs Unilateral W/chest Right 11/11/2012  *RADIOLOGY REPORT*  Clinical Data: Rib pain  RIGHT RIBS AND CHEST - 3+ VIEW  Comparison: 03/26/2012  Findings: Cardiomediastinal silhouette is stable.  Dual lead cardiac pacemaker is unchanged in position.  No acute infiltrate or pulmonary edema.  No right rib fracture is identified.  No diagnostic pneumothorax.  IMPRESSION: No active disease.  No right rib fracture is identified.  No diagnostic pneumothorax.   Original Report Authenticated By: Natasha Mead, M.D.     Results for JJESUS, DINGLEY (MRN [id number]) as of 11/11/2012 15:21   Ref. Range 04/17/2009 04:05 11/10/2010 05:35 11/11/2012 14:08  Pro B Natriuretic peptide (BNP) Latest Range: 0-125 pg/mL 52.9 167.0 (H) 210.4 (H)      1600:  Pt states he "feels better" after neb and steroid.  NAD, lungs coarse bilat, no wheezing, resps easy, speaking full sentences, Sats 97% R/A.  Pt ambulated around the ED with Sats remaining 92% R/A, resps easy, NAD.  Does not want to stay in the ED any longer and is becoming agitated with staff.  Offered admit but states he feels better and wants to go home right now.  Endorses he has a neb machine at home and "can treat myself at home."  Will tx for COPD exacerbation with neb, steroid and antibiotic.  Pt strongly encouraged to f/u with PMD in the next 2 days.  Dx and testing d/w pt and family.  Questions answered.  Verb understanding, agreeable to d/c home with outpt f/u.         Laray Anger, DO 11/13/12 2240

## 2012-11-11 NOTE — ED Notes (Signed)
Pt choked x 5 days ago and Hiemlick maneuver performed. Pt has had pain to right upper chest since. Pt also has a defibrillator on this side. Pain to touch on it and around the defibrillator. Hurts to breath. nad

## 2012-11-29 ENCOUNTER — Encounter: Payer: Self-pay | Admitting: *Deleted

## 2013-08-19 ENCOUNTER — Emergency Department (HOSPITAL_COMMUNITY)
Admission: EM | Admit: 2013-08-19 | Discharge: 2013-08-19 | Disposition: A | Discharge status: Home / Self Care | Payer: Medicare Other | Attending: Emergency Medicine | Admitting: Emergency Medicine

## 2013-08-19 ENCOUNTER — Emergency Department (HOSPITAL_COMMUNITY): Payer: Medicare Other

## 2013-08-19 ENCOUNTER — Encounter (HOSPITAL_COMMUNITY): Payer: Self-pay | Admitting: *Deleted

## 2013-08-19 DIAGNOSIS — X500XXA Overexertion from strenuous movement or load, initial encounter: Secondary | ICD-10-CM | POA: Insufficient documentation

## 2013-08-19 DIAGNOSIS — Y939 Activity, unspecified: Secondary | ICD-10-CM | POA: Insufficient documentation

## 2013-08-19 DIAGNOSIS — F411 Generalized anxiety disorder: Secondary | ICD-10-CM | POA: Insufficient documentation

## 2013-08-19 DIAGNOSIS — S76012A Strain of muscle, fascia and tendon of left hip, initial encounter: Secondary | ICD-10-CM

## 2013-08-19 DIAGNOSIS — F17200 Nicotine dependence, unspecified, uncomplicated: Secondary | ICD-10-CM | POA: Insufficient documentation

## 2013-08-19 DIAGNOSIS — Z794 Long term (current) use of insulin: Secondary | ICD-10-CM | POA: Insufficient documentation

## 2013-08-19 DIAGNOSIS — Z8614 Personal history of Methicillin resistant Staphylococcus aureus infection: Secondary | ICD-10-CM | POA: Insufficient documentation

## 2013-08-19 DIAGNOSIS — S0993XA Unspecified injury of face, initial encounter: Secondary | ICD-10-CM | POA: Insufficient documentation

## 2013-08-19 DIAGNOSIS — Y929 Unspecified place or not applicable: Secondary | ICD-10-CM | POA: Insufficient documentation

## 2013-08-19 DIAGNOSIS — Z862 Personal history of diseases of the blood and blood-forming organs and certain disorders involving the immune mechanism: Secondary | ICD-10-CM | POA: Insufficient documentation

## 2013-08-19 DIAGNOSIS — I509 Heart failure, unspecified: Secondary | ICD-10-CM | POA: Insufficient documentation

## 2013-08-19 DIAGNOSIS — Z7982 Long term (current) use of aspirin: Secondary | ICD-10-CM | POA: Insufficient documentation

## 2013-08-19 DIAGNOSIS — Z8673 Personal history of transient ischemic attack (TIA), and cerebral infarction without residual deficits: Secondary | ICD-10-CM | POA: Insufficient documentation

## 2013-08-19 DIAGNOSIS — F329 Major depressive disorder, single episode, unspecified: Secondary | ICD-10-CM | POA: Insufficient documentation

## 2013-08-19 DIAGNOSIS — I10 Essential (primary) hypertension: Secondary | ICD-10-CM | POA: Insufficient documentation

## 2013-08-19 DIAGNOSIS — Z8639 Personal history of other endocrine, nutritional and metabolic disease: Secondary | ICD-10-CM | POA: Insufficient documentation

## 2013-08-19 DIAGNOSIS — J4489 Other specified chronic obstructive pulmonary disease: Secondary | ICD-10-CM | POA: Insufficient documentation

## 2013-08-19 DIAGNOSIS — Z79899 Other long term (current) drug therapy: Secondary | ICD-10-CM | POA: Insufficient documentation

## 2013-08-19 DIAGNOSIS — M542 Cervicalgia: Secondary | ICD-10-CM

## 2013-08-19 DIAGNOSIS — F3289 Other specified depressive episodes: Secondary | ICD-10-CM | POA: Insufficient documentation

## 2013-08-19 DIAGNOSIS — S01312A Laceration without foreign body of left ear, initial encounter: Secondary | ICD-10-CM

## 2013-08-19 DIAGNOSIS — IMO0002 Reserved for concepts with insufficient information to code with codable children: Secondary | ICD-10-CM | POA: Insufficient documentation

## 2013-08-19 DIAGNOSIS — J449 Chronic obstructive pulmonary disease, unspecified: Secondary | ICD-10-CM | POA: Insufficient documentation

## 2013-08-19 DIAGNOSIS — Z8719 Personal history of other diseases of the digestive system: Secondary | ICD-10-CM | POA: Insufficient documentation

## 2013-08-19 DIAGNOSIS — E119 Type 2 diabetes mellitus without complications: Secondary | ICD-10-CM | POA: Insufficient documentation

## 2013-08-19 DIAGNOSIS — S01309A Unspecified open wound of unspecified ear, initial encounter: Secondary | ICD-10-CM | POA: Insufficient documentation

## 2013-08-19 DIAGNOSIS — W19XXXA Unspecified fall, initial encounter: Secondary | ICD-10-CM

## 2013-08-19 DIAGNOSIS — Z8739 Personal history of other diseases of the musculoskeletal system and connective tissue: Secondary | ICD-10-CM | POA: Insufficient documentation

## 2013-08-19 DIAGNOSIS — I2510 Atherosclerotic heart disease of native coronary artery without angina pectoris: Secondary | ICD-10-CM | POA: Insufficient documentation

## 2013-08-19 LAB — POCT I-STAT, CHEM 8
Creatinine, Ser: 0.9 mg/dL (ref 0.50–1.35)
Hemoglobin: 17.7 g/dL — ABNORMAL HIGH (ref 13.0–17.0)
Sodium: 140 mEq/L (ref 135–145)
TCO2: 30 mmol/L (ref 0–100)

## 2013-08-19 MED ORDER — LIDOCAINE HCL (PF) 1 % IJ SOLN
INTRAMUSCULAR | Status: AC
  Filled 2013-08-19: qty 5

## 2013-08-19 NOTE — ED Provider Notes (Signed)
CSN: [id number]     Arrival date & time 08/19/13  1018 History  This chart was scribed for Joya Gaskins, MD by Bennett Scrape, ED Scribe. This patient was seen in room APA14/APA14 and the patient's care was started at 2:07 PM.   Chief Complaint  Patient presents with  . Fall    Patient is a 57 y.o. male presenting with fall. The history is provided by the patient. No language interpreter was used.  Fall This is a recurrent problem. The current episode started 3 to 5 hours ago. Episode frequency: once. The problem has been resolved. Nothing aggravates the symptoms. Nothing relieves the symptoms. He has tried nothing for the symptoms.    HPI Comments: NIZAR CUTLER is a 57 y.o. male who presents to the Emergency Department complaining of a fall that occurred around 9a today. Pt reports a h/o 4 prior CVAs with a left-sided deficit. Due to this deficit, he experiences trouble balancing with a h/o frequent falls due to the same. He reports a fall while ambulating with a walker. He states that it slid out from under him and he fell into some furniture and glass in his home. He denies experiencing weakness or dizziness as preceding symptoms. He c/o laceration to the left ear, mild left knee and left hip pain. The bleeding to the laceration is controlled currently. He also reports having rib pain after the fall that has since resolved. He denies being on any anticoagulants currently. He mentions having non-bloody diarrhea for the past week that he has been treating with Imodium. He denies fevers, neck pain, LOC and emesis. He states that he is currently following up with therapy to improve his ambulation.   Past Medical History  Diagnosis Date  . Type 2 diabetes mellitus   . Hyperlipidemia   . Essential hypertension, benign   . Coronary artery disease     Possible - poorly documented  . Chronic pain   . Diverticulitis   . COPD (chronic obstructive pulmonary disease)   . Anxiety   .  Depression   . Cerebrovascular disease   . Nonischemic cardiomyopathy     LVEF 30-35% 5/11  . MRSA (methicillin resistant Staphylococcus aureus) septicemia   . Diabetes mellitus   . Arthritis   . Stroke     residual left hemiplegia, left visual field deficit  . CHF (congestive heart failure)    Past Surgical History  Procedure Laterality Date  . Right gluteal abscess drainage  2005  . Abdominal surgery  2004  . Ercp with stone extraction  2010  . Arthroscopic left shoulder surgery  2011  . Cardiac defibrillator placement  2005    [facility] - Dr. Severiano Gilbert, [facility] [employer] [facility]   Family History  Problem Relation Age of Onset  . Heart disease    . Cancer    . Diabetes     History  Substance Use Topics  . Smoking status: Current Every Day Smoker -- 1.50 packs/day for 30 years    Types: Cigarettes  . Smokeless tobacco: Never Used  . Alcohol Use: No    Review of Systems  A complete 10 system review of systems was obtained and all systems are negative except as noted in the HPI and PMH.   Allergies  Morphine  Home Medications   Current Outpatient Rx  Name  Route  Sig  Dispense  Refill  . albuterol (PROVENTIL) (2.5 MG/3ML) 0.083% nebulizer solution   Nebulization   Take  2.5 mg by nebulization every 6 (six) hours as needed.          Marland Kitchen aspirin EC 81 MG tablet   Oral   Take 81 mg by mouth daily.         . diazepam (VALIUM) 10 MG tablet   Oral   Take 10 mg by mouth 3 (three) times daily.         . furosemide (LASIX) 20 MG tablet   Oral   Take 20 mg by mouth every evening.          . insulin glargine (LANTUS) 100 UNIT/ML injection   Subcutaneous   Inject 72 Units into the skin at bedtime.          . mirtazapine (REMERON) 30 MG tablet   Oral   Take 30 mg by mouth at bedtime.         Marland Kitchen oxyCODONE (OXYCONTIN) 20 MG 12 hr tablet   Oral   Take 20 mg by mouth 4 (four) times daily. **Take as needed for pain         . traZODone (DESYREL) 50 MG tablet    Oral   Take 50 mg by mouth at bedtime.          Triage Vitals: BP 157/77  Pulse 84  Temp(Src) 97.9 F (36.6 C) (Oral)  Resp 17  Ht 6' (1.829 m)  Wt 248 lb (112.492 kg)  BMI 33.63 kg/m2  SpO2 100%  Physical Exam  Nursing note and vitals reviewed.  CONSTITUTIONAL: Well developed/well nourished HEAD: Normocephalic,  EYES: EOMI/PERRL ENMT: Mucous membranes moist, laceration to left pinna, cartilage is exposed, no mastoid tenderness Left TM is intact/clear and no foreign body in ear canal NECK: supple no meningeal signs SPINE:entire spine nontender, cervical paraspinal tenderness, No bruising/crepitance/stepoffs noted to spine CV: S1/S2 noted, no murmurs/rubs/gallops noted Chest - nontender to palpation LUNGS: Lungs are clear to auscultation bilaterally, no apparent distress ABDOMEN: soft, nontender, no rebound or guarding GU:no cva tenderness NEURO: Pt is awake/alert,baseline LU and LL paresis, GCS 15 EXTREMITIES: pulses normal, tenderness to ROM of left hip, All other extremities/joints palpated/ranged and nontender, healed scabs to both lower extremities  SKIN: warm, color normal PSYCH: no abnormalities of mood noted  ED Course  Procedures  LACERATION REPAIR Performed by: Joya Gaskins Consent: Verbal consent obtained. Risks and benefits: risks, benefits and alternatives were discussed Patient identity confirmed: provided demographic data Time out performed prior to procedure Prepped and Draped in normal sterile fashion Wound explored Laceration Location: left ear Laceration Length: 5cm No Foreign Bodies seen or palpated Anesthesia: local infiltration Local anesthetic: lidocaine 1% Anesthetic total: 5 ml Irrigation method: syringe Amount of cleaning: standard Skin closure: complex Number of sutures or staples: 6 vicryl Technique: I had to trim parts of the cartilage and then close wound.  Explored extensively and no foreign body noted.  No exposed cartilage  after suture placement Patient tolerance: Patient tolerated the procedure well with no immediate complications.  DIAGNOSTIC STUDIES: Oxygen Saturation is 100% on room air, normal by my interpretation.    COORDINATION OF CARE: 2:14 PM-Discussed treatment plan which includes laceration repair and hip x-ray with pt at bedside and pt agreed to plan.    Pt improved He is ambulatory at baseline He requests ace wrap for knee to help with support but denies pain Discussed wound care instructions and may need ENT f/u He has been seen by case management.  He has good in home support.  He  feels comfortable for d/c home His labs are reassuring MDM  No diagnosis found. Nursing notes including past medical history and social history reviewed and considered in documentation xrays reviewed and considered Labs/vital reviewed and considered   I personally performed the services described in this documentation, which was scribed in my presence. The recorded information has been reviewed and is accurate.      Joya Gaskins, MD 08/20/13 2233

## 2013-08-19 NOTE — ED Notes (Signed)
Dr. Bebe Shaggy in to assess pt and removed C-Collar

## 2013-08-19 NOTE — ED Notes (Addendum)
Fall , "my knee gave out on me" Lt knee.  Lac  Lt ear, "my ribs hurt" and neck does not "feel right".  No LOC.  Has old scabs to both knees.    C collar placed at triage.  Pt says he fell 3 times today.

## 2013-08-19 NOTE — Progress Notes (Signed)
1500 Spoke with patient about home situation, HH/PT,  and frequent falls per Dr Nelda Marseille request. Pt states he lives at home with spouse and adult son. He has a ramp to the outside, and  uses a walker. He states he "gets around pretty good", most of the time., but has suffered several falls lately. Family MD has ordered HH/PT for him with, he thinks, [facility]. Per [facility], they did not take his insurance and he was referred to [facility]. Per [facility], he was admitted on 9/15 by PT only, and they will follow for approx 3 more weeks, but if more time is needed, they will request this from the insurance.  1520 Spoke with Dr Bebe Shaggy and notified of above.  Spoke with pt again, and now son, in room. Explained that per  [facility], they  will continue for at least 3 more weeks. Pt and son appreciative of this and hope that  it will help with the falls problem.

## 2013-09-18 ENCOUNTER — Ambulatory Visit: Payer: Medicare Other | Admitting: Internal Medicine

## 2013-09-27 ENCOUNTER — Emergency Department (HOSPITAL_COMMUNITY)
Admission: EM | Admit: 2013-09-27 | Discharge: 2013-09-27 | Disposition: A | Discharge status: Home / Self Care | Payer: Medicare Other | Attending: Emergency Medicine | Admitting: Emergency Medicine

## 2013-09-27 ENCOUNTER — Encounter (HOSPITAL_COMMUNITY): Payer: Self-pay | Admitting: Emergency Medicine

## 2013-09-27 ENCOUNTER — Emergency Department (HOSPITAL_COMMUNITY): Payer: Medicare Other

## 2013-09-27 DIAGNOSIS — M542 Cervicalgia: Secondary | ICD-10-CM | POA: Insufficient documentation

## 2013-09-27 DIAGNOSIS — Z7982 Long term (current) use of aspirin: Secondary | ICD-10-CM | POA: Insufficient documentation

## 2013-09-27 DIAGNOSIS — I69998 Other sequelae following unspecified cerebrovascular disease: Secondary | ICD-10-CM | POA: Insufficient documentation

## 2013-09-27 DIAGNOSIS — M129 Arthropathy, unspecified: Secondary | ICD-10-CM | POA: Insufficient documentation

## 2013-09-27 DIAGNOSIS — F411 Generalized anxiety disorder: Secondary | ICD-10-CM | POA: Insufficient documentation

## 2013-09-27 DIAGNOSIS — R1032 Left lower quadrant pain: Secondary | ICD-10-CM | POA: Insufficient documentation

## 2013-09-27 DIAGNOSIS — Z794 Long term (current) use of insulin: Secondary | ICD-10-CM | POA: Insufficient documentation

## 2013-09-27 DIAGNOSIS — R5381 Other malaise: Secondary | ICD-10-CM | POA: Insufficient documentation

## 2013-09-27 DIAGNOSIS — J449 Chronic obstructive pulmonary disease, unspecified: Secondary | ICD-10-CM | POA: Insufficient documentation

## 2013-09-27 DIAGNOSIS — J4489 Other specified chronic obstructive pulmonary disease: Secondary | ICD-10-CM | POA: Insufficient documentation

## 2013-09-27 DIAGNOSIS — G8929 Other chronic pain: Secondary | ICD-10-CM | POA: Insufficient documentation

## 2013-09-27 DIAGNOSIS — Z79899 Other long term (current) drug therapy: Secondary | ICD-10-CM | POA: Insufficient documentation

## 2013-09-27 DIAGNOSIS — Z95810 Presence of automatic (implantable) cardiac defibrillator: Secondary | ICD-10-CM | POA: Insufficient documentation

## 2013-09-27 DIAGNOSIS — F17200 Nicotine dependence, unspecified, uncomplicated: Secondary | ICD-10-CM | POA: Insufficient documentation

## 2013-09-27 DIAGNOSIS — R1110 Vomiting, unspecified: Secondary | ICD-10-CM | POA: Insufficient documentation

## 2013-09-27 DIAGNOSIS — F329 Major depressive disorder, single episode, unspecified: Secondary | ICD-10-CM | POA: Insufficient documentation

## 2013-09-27 DIAGNOSIS — F3289 Other specified depressive episodes: Secondary | ICD-10-CM | POA: Insufficient documentation

## 2013-09-27 DIAGNOSIS — I2510 Atherosclerotic heart disease of native coronary artery without angina pectoris: Secondary | ICD-10-CM | POA: Insufficient documentation

## 2013-09-27 DIAGNOSIS — R062 Wheezing: Secondary | ICD-10-CM | POA: Insufficient documentation

## 2013-09-27 DIAGNOSIS — I509 Heart failure, unspecified: Secondary | ICD-10-CM | POA: Insufficient documentation

## 2013-09-27 DIAGNOSIS — E119 Type 2 diabetes mellitus without complications: Secondary | ICD-10-CM | POA: Insufficient documentation

## 2013-09-27 DIAGNOSIS — Z8719 Personal history of other diseases of the digestive system: Secondary | ICD-10-CM | POA: Insufficient documentation

## 2013-09-27 DIAGNOSIS — I10 Essential (primary) hypertension: Secondary | ICD-10-CM | POA: Insufficient documentation

## 2013-09-27 DIAGNOSIS — R197 Diarrhea, unspecified: Secondary | ICD-10-CM | POA: Insufficient documentation

## 2013-09-27 DIAGNOSIS — Z8614 Personal history of Methicillin resistant Staphylococcus aureus infection: Secondary | ICD-10-CM | POA: Insufficient documentation

## 2013-09-27 LAB — BASIC METABOLIC PANEL
Calcium: 9.9 mg/dL (ref 8.4–10.5)
GFR calc Af Amer: >90 mL/min (ref >90)
GFR calc non Af Amer: >90 mL/min (ref >90)
Sodium: 134 mEq/L — ABNORMAL LOW (ref 135–145)

## 2013-09-27 LAB — CBC WITH DIFFERENTIAL/PLATELET
Basophils Absolute: 0.1 10*3/uL (ref 0.0–0.1)
Basophils Relative: 0 % (ref 0–1)
Eosinophils Absolute: 0.4 10*3/uL (ref 0.0–0.7)
Eosinophils Relative: 3 % (ref 0–5)
Lymphocytes Relative: 22 % (ref 12–46)
MCH: 30.2 pg (ref 26.0–34.0)
MCV: 85 fL (ref 78.0–100.0)
Platelets: 223 10*3/uL (ref 150–400)
RDW: 12.7 % (ref 11.5–15.5)
WBC: 16.8 10*3/uL — ABNORMAL HIGH (ref 4.0–10.5)

## 2013-09-27 MED ORDER — OXYCODONE HCL ER 20 MG PO T12A: 20.0000 mg | EXTENDED_RELEASE_TABLET | Freq: Two times a day (BID) | ORAL | Status: AC

## 2013-09-27 MED ORDER — HYDROMORPHONE HCL PF 1 MG/ML IJ SOLN
1.0000 mg | Freq: Once | INTRAMUSCULAR | Status: AC
  Administered 2013-09-27: 1 mg via INTRAMUSCULAR
  Filled 2013-09-27: qty 1

## 2013-09-27 NOTE — ED Notes (Signed)
Pt reports LLQ pain x 2 weeks. Vomiting and diarrhea x 1 week.

## 2013-09-27 NOTE — ED Notes (Signed)
Patient with no complaints at this time. Respirations even and unlabored. Skin warm/dry. Discharge instructions reviewed with patient at this time. Patient given opportunity to voice concerns/ask questions. IV removed per policy and band-aid applied to site. Patient discharged at this time and left Emergency Department with steady gait.  

## 2013-09-27 NOTE — ED Provider Notes (Signed)
CSN: [id number]     Arrival date & time 09/27/13  1349 History  This chart was scribed for Peter Lennert, MD by Dorothey Baseman, ED Scribe. This patient was seen in room [facility]2/[facility]2 and the patient's care was started at 3:16 PM.    Chief Complaint  Patient presents with  . Abdominal Pain   Patient is a 57 y.o. male presenting with abdominal pain. The history is provided by the patient. No language interpreter was used.  Abdominal Pain Pain location:  LLQ Pain severity:  Moderate Timing:  Constant Context: recent illness   Associated symptoms: diarrhea and vomiting   Associated symptoms: no chest pain, no cough, no fatigue and no hematuria   Risk factors: recent hospitalization    HPI Comments: Peter Long is a 57 y.o. male with a history of hernia who presents to the Emergency Department complaining of a constant pain to the LLQ of the abdomen onset 2 weeks ago. Patient reports that he was hospitalized here 2 weeks ago for the hernia and was admitted to ICU. Patient reports associated emesis and diarrhea onset 1 week ago. Patient also reports associated weakness secondary to his history of multiple CVAs. He states that he takes oxycodone daily for his chronic neck pain and has recently run out of his prescription with associated pain exacerbation (about 5 days ago). Patient reports that he has been falling on his side and into furniture a lot. Patient reports a history of diverticulitis, DM, HTN, CAD, and CHF.   Past Medical History  Diagnosis Date  . Type 2 diabetes mellitus   . Hyperlipidemia   . Essential hypertension, benign   . Coronary artery disease     Possible - poorly documented  . Chronic pain   . Diverticulitis   . COPD (chronic obstructive pulmonary disease)   . Anxiety   . Depression   . Cerebrovascular disease   . Nonischemic cardiomyopathy     LVEF 30-35% 5/11  . MRSA (methicillin resistant Staphylococcus aureus) septicemia   . Diabetes mellitus   . Arthritis    . Stroke     residual left hemiplegia, left visual field deficit  . CHF (congestive heart failure)    Past Surgical History  Procedure Laterality Date  . Right gluteal abscess drainage  2005  . Abdominal surgery  2004  . Ercp with stone extraction  2010  . Arthroscopic left shoulder surgery  2011  . Cardiac defibrillator placement  2005    [facility] - Dr. Severiano Gilbert, [facility] [facility] [employer] [facility]   Family History  Problem Relation Age of Onset  . Heart disease    . Cancer    . Diabetes     History  Substance Use Topics  . Smoking status: Current Every Day Smoker -- 1.50 packs/day for 30 years    Types: Cigarettes  . Smokeless tobacco: Never Used  . Alcohol Use: No    Review of Systems  Constitutional: Negative for appetite change and fatigue.  HENT: Negative for congestion, ear discharge and sinus pressure.   Eyes: Negative for discharge.  Respiratory: Negative for cough.   Cardiovascular: Negative for chest pain.  Gastrointestinal: Positive for vomiting, abdominal pain and diarrhea.  Genitourinary: Negative for frequency and hematuria.  Musculoskeletal: Positive for neck pain. Negative for back pain.  Skin: Negative for rash.  Neurological: Positive for weakness. Negative for seizures and headaches.  Psychiatric/Behavioral: Negative for hallucinations.    Allergies  Morphine  Home Medications   Current Outpatient  Rx  Name  Route  Sig  Dispense  Refill  . albuterol (PROVENTIL) (2.5 MG/3ML) 0.083% nebulizer solution   Nebulization   Take 2.5 mg by nebulization every 6 (six) hours as needed for wheezing or shortness of breath.          Marland Kitchen aspirin EC 81 MG tablet   Oral   Take 81 mg by mouth daily.         . carvedilol (COREG) 25 MG tablet   Oral   Take 25 mg by mouth 2 (two) times daily with a meal.         . citalopram (CELEXA) 40 MG tablet   Oral   Take 40 mg by mouth daily.         . diazepam (VALIUM) 10 MG tablet   Oral   Take 10 mg by mouth 3 (three) times  daily.         . furosemide (LASIX) 20 MG tablet   Oral   Take 20 mg by mouth every evening.          . insulin glargine (LANTUS) 100 UNIT/ML injection   Subcutaneous   Inject 72 Units into the skin at bedtime.          Marland Kitchen lisinopril (PRINIVIL,ZESTRIL) 20 MG tablet   Oral   Take 20 mg by mouth daily.         Marland Kitchen oxyCODONE (OXYCONTIN) 20 MG 12 hr tablet   Oral   Take 20 mg by mouth 4 (four) times daily. **Take as needed for pain         . traZODone (DESYREL) 50 MG tablet   Oral   Take 50 mg by mouth at bedtime.          Triage Vitals: BP 147/92  Pulse 110  Temp(Src) 98.7 F (37.1 C) (Oral)  Resp 18  Ht 6' (1.829 m)  Wt 250 lb (113.399 kg)  BMI 33.90 kg/m2  SpO2 98%  Physical Exam  Constitutional: He is oriented to person, place, and time. He appears well-developed.  HENT:  Head: Normocephalic.  Eyes: Conjunctivae and EOM are normal. No scleral icterus.  Neck: Neck supple. No thyromegaly present.  Mild posterior tenderness to palpation.   Cardiovascular: Normal rate, regular rhythm and normal heart sounds.  Exam reveals no gallop and no friction rub.   No murmur heard. Pulmonary/Chest: Effort normal. No stridor. No respiratory distress. He has wheezes. He has no rales. He exhibits no tenderness.  Mild wheezing bilaterally.   Abdominal: He exhibits no distension. There is tenderness. There is no rebound.  Mild periumbilical tenderness to palpation.   Musculoskeletal: Normal range of motion. He exhibits no edema.  Profound left leg weakness.   Lymphadenopathy:    He has no cervical adenopathy.  Neurological: He is oriented to person, place, and time. He exhibits normal muscle tone. Coordination normal.  Skin: No rash noted. No erythema.  Psychiatric: He has a normal mood and affect. His behavior is normal.    ED Course  Procedures (including critical care time)  DIAGNOSTIC STUDIES: Oxygen Saturation is 98% on room air, normal by my interpretation.     COORDINATION OF CARE: 3:20 PM- Ordered blood labs and CTs of the head and abdomen. Will order Dilaudid to manage symptoms Discussed treatment plan with patient at bedside and patient verbalized agreement.     Labs Review Labs Reviewed  GLUCOSE, CAPILLARY - Abnormal; Notable for the following:    Glucose-Capillary 296 (*)  All other components within normal limits  CBC WITH DIFFERENTIAL - Abnormal; Notable for the following:    WBC 16.8 (*)    RBC 6.32 (*)    Hemoglobin 19.1 (*)    HCT 53.7 (*)    Neutro Abs 11.5 (*)    Monocytes Absolute 1.1 (*)    All other components within normal limits  BASIC METABOLIC PANEL - Abnormal; Notable for the following:    Sodium 134 (*)    Potassium 3.1 (*)    Chloride 94 (*)    Glucose, Bld 282 (*)    All other components within normal limits  URINALYSIS, ROUTINE W REFLEX MICROSCOPIC   Imaging Review Ct Abdomen Pelvis Wo Contrast  09/27/2013   CLINICAL DATA:  Left lower quadrant pain x2 weeks. Generalized weakness.  EXAM: CT ABDOMEN AND PELVIS WITHOUT CONTRAST  TECHNIQUE: Multidetector CT imaging of the abdomen and pelvis was performed following the standard protocol without intravenous contrast.  COMPARISON:  Abdomen CT 03/16/2012.  FINDINGS: Visualization of the lower thorax demonstrates minimal dependent atelectasis within the right lower lobe. Calcified pulmonary nodules within the lingula and left lower lobe. Normal heart size. No pericardial effusion.  Lack of intravenous contrast material limits evaluation of the solid organ parenchyma. Liver is diffusely low in attenuation. Status post cholecystectomy. The spleen, pancreas and bilateral adrenal glands are unremarkable. The kidneys are symmetric in size. No hydronephrosis. Unchanged mild perinephric fat stranding, nonspecific. Grossly similar sub cm low-attenuation lesion within the interpolar region of the left kidney. There is a 3 mm nonobstructing calculus within the interpolar region of  the right kidney.  Normal caliber abdominal aorta with scattered calcified atherosclerotic plaque. Urinary bladder is grossly unremarkable.  Persistent wide mouth ventral abdominal wall hernia containing nonobstructed loops of small bowel. Overall this is grossly similar in appearance when compared to recent prior examination. Postsurgical change involving the colon. No abnormal bowel wall thickening. No evidence for bowel obstruction. No free fluid or free intraperitoneal air.  Small amount of fluid, fat stranding and blood products within the subcutaneous fat overlying the left greater trochanter. There is a minimally displaced fracture of left L2 transverse process.  IMPRESSION: 1. Minimally displaced fracture through the left L2 transverse process. 2. Small amount of subcutaneous fat stranding and blood products at the level of the left greater trochanter. 3. No significant interval change in size or appearance of complex ventral abdominal wall hernia containing nonobstructed loops of small bowel. 4. Hepatic steatosis. 5. Nephrolithiasis right kidney.   Electronically Signed   By: Annia Belt M.D.   On: 09/27/2013 16:38   Ct Head Wo Contrast  09/27/2013   CLINICAL DATA:  Generalized weakness, multiple falls  EXAM: CT HEAD WITHOUT CONTRAST  TECHNIQUE: Contiguous axial images were obtained from the base of the skull through the vertex without intravenous contrast.  COMPARISON:  Prior head CT 03/27/2012  FINDINGS: Negative for acute intracranial hemorrhage, acute infarction, mass, mass effect, hydrocephalus or midline shift. Gray-white differentiation is preserved throughout. Similar appearance of remote right MCA and PCA territory infarcts. There is slightly increased prominence of high attenuation within the infarcted MCA territory parenchyma compared to prior likely representing slight interval progression of mineralization. Stable background of atrophy. Globes and orbits are symmetric and unremarkable. No  acute soft tissue or calvarial abnormality. Normal aeration of the bilateral mastoid air cells and paranasal sinuses.  IMPRESSION: 1. No acute intracranial abnormality. 2. Slight interval progression of high attenuation mineralization within the remote right MCA infarct.  3. Stable remote right PCA territory infarct.   Electronically Signed   By: Malachy Moan M.D.   On: 09/27/2013 16:21    EKG Interpretation   None       MDM  No diagnosis found.   The chart was scribed for me under my direct supervision.  I personally performed the history, physical, and medical decision making and all procedures in the evaluation of this patient.Peter Lennert, MD 09/27/13 [phone]

## 2013-10-28 ENCOUNTER — Encounter: Payer: Medicare Other | Admitting: Internal Medicine

## 2013-10-30 ENCOUNTER — Encounter (HOSPITAL_COMMUNITY): Payer: Self-pay | Admitting: Emergency Medicine

## 2013-10-30 ENCOUNTER — Emergency Department (HOSPITAL_COMMUNITY): Payer: Medicare Other

## 2013-10-30 ENCOUNTER — Inpatient Hospital Stay (HOSPITAL_COMMUNITY)
Admission: EM | Admit: 2013-10-30 | Discharge: 2013-11-21 | DRG: 064 | Disposition: E | Payer: Medicare Other | Attending: Internal Medicine | Admitting: Internal Medicine

## 2013-10-30 ENCOUNTER — Inpatient Hospital Stay (HOSPITAL_COMMUNITY): Payer: Medicare Other

## 2013-10-30 DIAGNOSIS — I509 Heart failure, unspecified: Secondary | ICD-10-CM | POA: Diagnosis present

## 2013-10-30 DIAGNOSIS — I6350 Cerebral infarction due to unspecified occlusion or stenosis of unspecified cerebral artery: Secondary | ICD-10-CM | POA: Diagnosis present

## 2013-10-30 DIAGNOSIS — G8929 Other chronic pain: Secondary | ICD-10-CM

## 2013-10-30 DIAGNOSIS — F419 Anxiety disorder, unspecified: Secondary | ICD-10-CM

## 2013-10-30 DIAGNOSIS — I2510 Atherosclerotic heart disease of native coronary artery without angina pectoris: Secondary | ICD-10-CM | POA: Diagnosis present

## 2013-10-30 DIAGNOSIS — Z889 Allergy status to unspecified drugs, medicaments and biological substances status: Secondary | ICD-10-CM

## 2013-10-30 DIAGNOSIS — E119 Type 2 diabetes mellitus without complications: Secondary | ICD-10-CM | POA: Diagnosis present

## 2013-10-30 DIAGNOSIS — I10 Essential (primary) hypertension: Secondary | ICD-10-CM | POA: Diagnosis present

## 2013-10-30 DIAGNOSIS — D72829 Elevated white blood cell count, unspecified: Secondary | ICD-10-CM | POA: Diagnosis present

## 2013-10-30 DIAGNOSIS — G9340 Encephalopathy, unspecified: Secondary | ICD-10-CM

## 2013-10-30 DIAGNOSIS — Z794 Long term (current) use of insulin: Secondary | ICD-10-CM

## 2013-10-30 DIAGNOSIS — F411 Generalized anxiety disorder: Secondary | ICD-10-CM | POA: Diagnosis present

## 2013-10-30 DIAGNOSIS — Z8614 Personal history of Methicillin resistant Staphylococcus aureus infection: Secondary | ICD-10-CM | POA: Diagnosis present

## 2013-10-30 DIAGNOSIS — J9600 Acute respiratory failure, unspecified whether with hypoxia or hypercapnia: Secondary | ICD-10-CM | POA: Diagnosis present

## 2013-10-30 DIAGNOSIS — Z515 Encounter for palliative care: Secondary | ICD-10-CM | POA: Diagnosis not present

## 2013-10-30 DIAGNOSIS — Z66 Do not resuscitate: Secondary | ICD-10-CM | POA: Diagnosis not present

## 2013-10-30 DIAGNOSIS — I69959 Hemiplegia and hemiparesis following unspecified cerebrovascular disease affecting unspecified side: Secondary | ICD-10-CM

## 2013-10-30 DIAGNOSIS — I639 Cerebral infarction, unspecified: Secondary | ICD-10-CM

## 2013-10-30 DIAGNOSIS — J4489 Other specified chronic obstructive pulmonary disease: Secondary | ICD-10-CM | POA: Diagnosis present

## 2013-10-30 DIAGNOSIS — Z7982 Long term (current) use of aspirin: Secondary | ICD-10-CM

## 2013-10-30 DIAGNOSIS — Z79899 Other long term (current) drug therapy: Secondary | ICD-10-CM

## 2013-10-30 DIAGNOSIS — F329 Major depressive disorder, single episode, unspecified: Secondary | ICD-10-CM | POA: Diagnosis present

## 2013-10-30 DIAGNOSIS — I429 Cardiomyopathy, unspecified: Secondary | ICD-10-CM | POA: Diagnosis present

## 2013-10-30 DIAGNOSIS — G936 Cerebral edema: Secondary | ICD-10-CM | POA: Diagnosis present

## 2013-10-30 DIAGNOSIS — E876 Hypokalemia: Secondary | ICD-10-CM | POA: Diagnosis present

## 2013-10-30 DIAGNOSIS — F3289 Other specified depressive episodes: Secondary | ICD-10-CM | POA: Diagnosis present

## 2013-10-30 DIAGNOSIS — F17200 Nicotine dependence, unspecified, uncomplicated: Secondary | ICD-10-CM | POA: Diagnosis present

## 2013-10-30 DIAGNOSIS — I6340 Cerebral infarction due to embolism of unspecified cerebral artery: Principal | ICD-10-CM | POA: Diagnosis present

## 2013-10-30 DIAGNOSIS — F1210 Cannabis abuse, uncomplicated: Secondary | ICD-10-CM | POA: Diagnosis present

## 2013-10-30 DIAGNOSIS — J449 Chronic obstructive pulmonary disease, unspecified: Secondary | ICD-10-CM | POA: Diagnosis present

## 2013-10-30 DIAGNOSIS — R569 Unspecified convulsions: Secondary | ICD-10-CM | POA: Diagnosis present

## 2013-10-30 DIAGNOSIS — Z95810 Presence of automatic (implantable) cardiac defibrillator: Secondary | ICD-10-CM | POA: Diagnosis present

## 2013-10-30 DIAGNOSIS — H53469 Homonymous bilateral field defects, unspecified side: Secondary | ICD-10-CM | POA: Diagnosis present

## 2013-10-30 DIAGNOSIS — G894 Chronic pain syndrome: Secondary | ICD-10-CM | POA: Diagnosis present

## 2013-10-30 DIAGNOSIS — E785 Hyperlipidemia, unspecified: Secondary | ICD-10-CM | POA: Diagnosis present

## 2013-10-30 LAB — CBC
HCT: 47.8 % (ref 39.0–52.0)
RDW: 13.1 % (ref 11.5–15.5)
WBC: 12.2 10*3/uL — ABNORMAL HIGH (ref 4.0–10.5)

## 2013-10-30 LAB — PROTIME-INR: INR: 1.01 (ref 0.00–1.49)

## 2013-10-30 LAB — POCT I-STAT, CHEM 8
BUN: 8 mg/dL (ref 6–23)
Chloride: 101 mEq/L (ref 96–112)
Creatinine, Ser: 1.2 mg/dL (ref 0.50–1.35)
Glucose, Bld: 150 mg/dL — ABNORMAL HIGH (ref 70–99)
Hemoglobin: 17 g/dL (ref 13.0–17.0)
Potassium: 3.9 mEq/L (ref 3.5–5.1)
Sodium: 142 mEq/L (ref 135–145)

## 2013-10-30 LAB — COMPREHENSIVE METABOLIC PANEL
ALT: 13 U/L (ref 0–53)
AST: 18 U/L (ref 0–37)
Albumin: 3.6 g/dL (ref 3.5–5.2)
Alkaline Phosphatase: 148 U/L — ABNORMAL HIGH (ref 39–117)
CO2: 30 mEq/L (ref 19–32)
Chloride: 102 mEq/L (ref 96–112)
Creatinine, Ser: 0.96 mg/dL (ref 0.50–1.35)
GFR calc Af Amer: >90 mL/min (ref >90)
GFR calc non Af Amer: 90 mL/min — ABNORMAL LOW (ref >90)
Glucose, Bld: 154 mg/dL — ABNORMAL HIGH (ref 70–99)
Potassium: 3.8 mEq/L (ref 3.5–5.1)
Sodium: 138 mEq/L (ref 135–145)
Total Bilirubin: 0.5 mg/dL (ref 0.3–1.2)

## 2013-10-30 LAB — GLUCOSE, CAPILLARY: Glucose-Capillary: 167 mg/dL — ABNORMAL HIGH (ref 70–99)

## 2013-10-30 LAB — POCT I-STAT TROPONIN I: Troponin i, poc: 0 ng/mL (ref 0.00–0.08)

## 2013-10-30 LAB — APTT: aPTT: 28 seconds (ref 24–37)

## 2013-10-30 LAB — ETHANOL: Alcohol, Ethyl (B): <11 mg/dL (ref 0–11)

## 2013-10-30 LAB — DIFFERENTIAL
Basophils Absolute: 0 10*3/uL (ref 0.0–0.1)
Basophils Relative: 0 % (ref 0–1)
Lymphocytes Relative: 19 % (ref 12–46)
Lymphs Abs: 2.3 10*3/uL (ref 0.7–4.0)
Monocytes Absolute: 0.8 10*3/uL (ref 0.1–1.0)
Neutro Abs: 8.6 10*3/uL — ABNORMAL HIGH (ref 1.7–7.7)
Neutrophils Relative %: 71 % (ref 43–77)

## 2013-10-30 MED ORDER — SODIUM CHLORIDE 0.9 % IV SOLN
1000.0000 mg | INTRAVENOUS | Status: AC
  Administered 2013-10-30: 1000 mg via INTRAVENOUS
  Filled 2013-10-30: qty 10

## 2013-10-30 MED ORDER — LEVETIRACETAM 500 MG PO TABS
500.0000 mg | ORAL_TABLET | Freq: Two times a day (BID) | ORAL | Status: DC
  Administered 2013-10-31 – 2013-11-01 (×3): 500 mg via ORAL
  Filled 2013-10-30 (×5): qty 1

## 2013-10-30 NOTE — ED Notes (Signed)
Family stated that he was not acting right and seemed to be weak.  Also stated it seemed like he could not see   EMS stated he could see them without difficulty.  Patient stated that he just got mixed up with the wrong group.  Patient states that he is felling fine and does not have any complaints

## 2013-10-30 NOTE — Code Documentation (Signed)
57 yo wm brought in via [facility] for confusion.  On arrival pt was found to have a gaze preference & visual loss.  Code stroke called 2138. Pt arrival 2022, LKW 1730, stroke team arrival 2141, neurologist arrival 2140, pt arrival 2145, code stroke canceled 2216.  NIH 6. Presumed seizure.

## 2013-10-30 NOTE — Consult Note (Addendum)
Neurology Consultation Reason for Consult: Altered Mental status Referring Physician: Alric Ran  CC: AMS  History is obtained from:Wife, son  HPI: Peter Long is a 57 y.o. male with a history of strokes and DM who was last seen normal at 5:30pm. At around, 7 pm, the wife noted that he "contorted" and tightend up throughout the left side. His son reports tonic left gaze deviation and head turning at onset. He lost urine and bowel continence with that episode. Since that time, he has been acting confused, but appears to be improving some per the son.    LKW: 5:30pm tpa given?: no, outside of window    ROS: A 14 point ROS was performed and is negative except as noted in the HPI.  Past Medical History  Diagnosis Date  . Type 2 diabetes mellitus   . Hyperlipidemia   . Essential hypertension, benign   . Coronary artery disease     Possible - poorly documented  . Chronic pain   . Diverticulitis   . COPD (chronic obstructive pulmonary disease)   . Anxiety   . Depression   . Cerebrovascular disease   . Nonischemic cardiomyopathy     LVEF 30-35% 5/11  . MRSA (methicillin resistant Staphylococcus aureus) septicemia   . Diabetes mellitus   . Arthritis   . Stroke     residual left hemiplegia, left visual field deficit  . CHF (congestive heart failure)     Family History: No hx seizures  Social History: Tob: current smoker.   Exam: Current vital signs: BP 175/89  Pulse 76  Temp(Src) 98.2 F (36.8 C) (Oral)  Resp 12  Ht 6' (1.829 m)  Wt 112.946 kg (249 lb)  BMI 33.76 kg/m2  SpO2 99% Vital signs in last 24 hours: Temp:  [98.2 F (36.8 C)] 98.2 F (36.8 C) (12/10 2049) Pulse Rate:  [76] 76 (12/10 2049) Resp:  [12] 12 (12/10 2049) BP: (175)/(89) 175/89 mmHg (12/10 2049) SpO2:  [99 %] 99 % (12/10 2049) Weight:  [112.946 kg (249 lb)] 112.946 kg (249 lb) (12/10 2054)  General: in bed, NAD CV: RRR Mental Status: Patient is awake, alert, oriented to person, ,  gives "[name]" as location, gives "1995" as year, "23" as age, and  Patient is able to give a clear and coherent history. Does not seem to respond to stimuli on the left as well as the right.  He has a gaze preference to the left, but is able to cross midline to look to the right.  He confabulates, stating that  Cranial Nerves: II: Visual Fields are full. Pupils are equal, round, and reactive to light.  Discs are difficult to visualize. III,IV, VI: EOMI without ptosis or diploplia.  V: Facial sensation is symmetric to temperature VII: Facial movement is symmetric.  VIII: hearing is intact to voice X: Uvula elevates symmetrically XI: Shoulder shrug is symmetric. XII: tongue is midline without atrophy or fasciculations.  Motor: Tone is normal. Bulk is normal. 5/5 strength was present on the right, 4/5 on the left.  Sensory: Sensation is symmetric to light touch and temperature in arms and legs. He does extinguish Deep Tendon Reflexes: 2+ and symmetric in the biceps and patellae.  Cerebellar: FNF intact on right, impaired consistent with weakness on left.  Gait: Not assessed due to acute nature of evaluation and multiple medical monitors in ED setting.  I have reviewed labs in epic and the results pertinent to this consultation are: Leukocytosis  I have  reviewed the images obtained:CT head- old right sided infarcts.   Impression: 57 yo M with episode of left arm and leg tightening, incontenance most consistent with seizure. He appears to be improving some, though is still confused. I suspect that he had a seizure and remains post-ictal at this time.   My suspicion for new stroke is low, though it is possible.   Recommendations: 1) Keppra 1000mg  x 1, then 500mg  bid 2) EEG in the am 3) repeat CT in 24 - 48 hours to confirm no new infarct.  4) continue ASA for stroke prevention.    Ritta Slot, MD [facility] [phone]  If 7pm- 7am, please page  neurology on call at [phone].

## 2013-10-30 NOTE — ED Provider Notes (Signed)
CSN: [id number]     Arrival date & time 11/20/2013  2022 History   First MD Initiated Contact with Patient 11-20-13 2051     Chief Complaint  Patient presents with  . Fatigue   (Consider location/radiation/quality/duration/timing/severity/associated sxs/prior Treatment) HPI Comments: 57 yo male brought in by family member for 2.5 hours of AMS, transient distorted vision. Patient did not seem to recognize family members. Patient currently thinks it's 94 and February. Denies any cough, fever, CP, dyspnea or urinary sx. Has had prior CVAs, and has chronic left sided weakness. Patient denies vision trouble now. Further history is difficult due to patient being altered.   Past Medical History  Diagnosis Date  . Type 2 diabetes mellitus   . Hyperlipidemia   . Essential hypertension, benign   . Coronary artery disease     Possible - poorly documented  . Chronic pain   . Diverticulitis   . COPD (chronic obstructive pulmonary disease)   . Anxiety   . Depression   . Cerebrovascular disease   . Nonischemic cardiomyopathy     LVEF 30-35% 5/11  . MRSA (methicillin resistant Staphylococcus aureus) septicemia   . Diabetes mellitus   . Arthritis   . Stroke     residual left hemiplegia, left visual field deficit  . CHF (congestive heart failure)    Past Surgical History  Procedure Laterality Date  . Right gluteal abscess drainage  2005  . Abdominal surgery  2004  . Ercp with stone extraction  2010  . Arthroscopic left shoulder surgery  2011  . Cardiac defibrillator placement  2005    [facility] - Dr. Severiano Gilbert, [facility]   Family History  Problem Relation Age of Onset  . Heart disease    . Cancer    . Diabetes     History  Substance Use Topics  . Smoking status: Current Every Day Smoker -- 1.50 packs/day for 30 years    Types: Cigarettes  . Smokeless tobacco: Never Used  . Alcohol Use: No    Review of Systems  Unable to perform ROS: Mental status change    Allergies   Morphine  Home Medications   Current Outpatient Rx  Name  Route  Sig  Dispense  Refill  . albuterol (PROVENTIL) (2.5 MG/3ML) 0.083% nebulizer solution   Nebulization   Take 2.5 mg by nebulization every 6 (six) hours as needed for wheezing or shortness of breath.          Marland Kitchen aspirin EC 81 MG tablet   Oral   Take 81 mg by mouth daily.         . carvedilol (COREG) 25 MG tablet   Oral   Take 25 mg by mouth 2 (two) times daily with a meal.         . citalopram (CELEXA) 40 MG tablet   Oral   Take 40 mg by mouth daily.         . diazepam (VALIUM) 10 MG tablet   Oral   Take 10 mg by mouth 3 (three) times daily.         . furosemide (LASIX) 20 MG tablet   Oral   Take 20 mg by mouth every evening.          . insulin glargine (LANTUS) 100 UNIT/ML injection   Subcutaneous   Inject 72 Units into the skin at bedtime.          Marland Kitchen lisinopril (PRINIVIL,ZESTRIL) 20 MG tablet  Oral   Take 20 mg by mouth daily.         . mirtazapine (REMERON) 30 MG tablet   Oral   Take 30 mg by mouth at bedtime.         Marland Kitchen oxyCODONE (OXYCONTIN) 20 MG 12 hr tablet   Oral   Take 20 mg by mouth 4 (four) times daily. **Take as needed for pain         . OxyCODONE (OXYCONTIN) 20 mg T12A 12 hr tablet   Oral   Take 1 tablet (20 mg total) by mouth every 12 (twelve) hours.   30 tablet   0   . traZODone (DESYREL) 50 MG tablet   Oral   Take 50 mg by mouth at bedtime.          BP 175/89  Pulse 76  Temp(Src) 98.2 F (36.8 C) (Oral)  Resp 12  Ht 6' (1.829 m)  Wt 249 lb (112.946 kg)  BMI 33.76 kg/m2  SpO2 99% Physical Exam  Nursing note and vitals reviewed. Constitutional: He appears well-developed and well-nourished.  HENT:  Head: Normocephalic and atraumatic.  Right Ear: External ear normal.  Left Ear: External ear normal.  Nose: Nose normal.  Eyes: EOM are normal. Pupils are equal, round, and reactive to light. Right eye exhibits no discharge. Left eye exhibits no  discharge.  Neck: Neck supple.  Cardiovascular: Normal rate, regular rhythm, normal heart sounds and intact distal pulses.   Pulmonary/Chest: Effort normal and breath sounds normal.  Abdominal: Soft. There is no tenderness.  Musculoskeletal: He exhibits no edema.  Neurological: He is alert. He is disoriented. No cranial nerve deficit or sensory deficit.  Decreased strength in LUE and LLE - c/w prior CVA  Skin: Skin is warm and dry.    ED Course  Procedures (including critical care time) Labs Review Labs Reviewed  CBC - Abnormal; Notable for the following:    WBC 12.2 (*)    Hemoglobin 17.1 (*)    All other components within normal limits  DIFFERENTIAL - Abnormal; Notable for the following:    Neutro Abs 8.6 (*)    All other components within normal limits  COMPREHENSIVE METABOLIC PANEL - Abnormal; Notable for the following:    Glucose, Bld 154 (*)    Alkaline Phosphatase 148 (*)    GFR calc non Af Amer 90 (*)    All other components within normal limits  GLUCOSE, CAPILLARY - Abnormal; Notable for the following:    Glucose-Capillary 167 (*)    All other components within normal limits  POCT I-STAT, CHEM 8 - Abnormal; Notable for the following:    Glucose, Bld 150 (*)    All other components within normal limits  ETHANOL  PROTIME-INR  APTT  TROPONIN I  AMMONIA  URINE RAPID DRUG SCREEN (HOSP PERFORMED)  URINALYSIS, ROUTINE W REFLEX MICROSCOPIC  POCT I-STAT TROPONIN I   Imaging Review Ct Head Wo Contrast  2013-11-09   CLINICAL DATA:  Weakness.  Altered mental status.  Fatigue.  EXAM: CT HEAD WITHOUT CONTRAST  TECHNIQUE: Contiguous axial images were obtained from the base of the skull through the vertex without intravenous contrast.  COMPARISON:  09/27/2013  FINDINGS: Prior right side at occipital, temporal, and parietal lobe infarcts with chronic dystrophic calcification along the superior infarct margin.  Periventricular white matter and corona radiata hypodensities favor  chronic ischemic microvascular white matter disease.  No acute intracranial findings. No intracranial hemorrhage, mass lesion, or acute CVA.  IMPRESSION:  1. Old right-sided infarcts in the occipital, parietal, and temporal lobes. No acute intracranial findings.   Electronically Signed   By: Herbie Baltimore M.D.   On: 11/19/2013 22:09    EKG Interpretation   None       MDM   1. Seizure   2. Cerebral vascular accident   3. Chronic pain   4. Type 2 diabetes mellitus    D/w Dr. Amada Jupiter of Neuro and due to acuity of mental status change and vision changes, recommends code stroke being called. CT shows no new findings. When wife arrived, she describes more of a sz per neuro with left arm stiffening and eye deviation. Never had a prior sz history. Likely is postictal now. Airway stable. Will start keppra per neuro recs and admit to hospitalist.   Audree Camel, MD 10/31/13 0300

## 2013-10-30 NOTE — H&P (Signed)
[facility] History and Physical  Peter Long ZOX:[id number] DOB: Sep 12, 1956 DOA: 11/15/2013  Referring physician: ER physician. PCP: Kirk Ruths, MD   Chief Complaint: Altered mental status.  HPI: Peter Long is a 57 y.o. male with history of CVA with left-sided hemiplegia, nonischemic cardiomyopathy status post AICD placement, hypertension and hyperlipidemia and diabetes mellitus was found to be confused and had tightening of his left extremity as witnessed by patient's son. Patient's son noticed these signs around 6:45 PM. Patient's face and eyes were drawn towards the left. Patient was not responding well to questions. Looked completely confused. EMS was called and patient was brought to the ER. CT head did not show anything acute. Neurologist on-call was consulted and patient was started on Keppra IV for possible seizures. By the time patient had reached ER patient's symptoms had improved. Patient is presently confused. Patient otherwise has per the family did not have any nausea vomiting chest pain shortness of breath abdominal pain diarrhea fever chills or did not complain of any headaches.   Review of Systems: As presented in the history of presenting illness, rest negative.  Past Medical History  Diagnosis Date  . Type 2 diabetes mellitus   . Hyperlipidemia   . Essential hypertension, benign   . Coronary artery disease     Possible - poorly documented  . Chronic pain   . Diverticulitis   . COPD (chronic obstructive pulmonary disease)   . Anxiety   . Depression   . Cerebrovascular disease   . Nonischemic cardiomyopathy     LVEF 30-35% 5/11  . MRSA (methicillin resistant Staphylococcus aureus) septicemia   . Diabetes mellitus   . Arthritis   . Stroke     residual left hemiplegia, left visual field deficit  . CHF (congestive heart failure)    Past Surgical History  Procedure Laterality Date  . Right gluteal abscess drainage  2005  . Abdominal surgery   2004  . Ercp with stone extraction  2010  . Arthroscopic left shoulder surgery  2011  . Cardiac defibrillator placement  2005    [facility] - Dr. Severiano Gilbert, [facility]   Social History:  reports that he has been smoking Cigarettes.  He has a 45 pack-year smoking history. He has never used smokeless tobacco. He reports that he uses illicit drugs (Marijuana). He reports that he does not drink alcohol. Where does patient live home. Can patient participate in ADLs? Not sure.  Allergies  Allergen Reactions  . Morphine Other (See Comments)    Aggitation, Meanness.     Family History:  Family History  Problem Relation Age of Onset  . Heart disease    . Cancer    . Diabetes        Prior to Admission medications   Medication Sig Start Date End Date Taking? Authorizing Provider  albuterol (PROVENTIL) (2.5 MG/3ML) 0.083% nebulizer solution Take 2.5 mg by nebulization every 6 (six) hours as needed for wheezing or shortness of breath.    Yes Historical Provider, MD  aspirin EC 81 MG tablet Take 81 mg by mouth daily.   Yes Historical Provider, MD  carvedilol (COREG) 25 MG tablet Take 25 mg by mouth 2 (two) times daily with a meal.   Yes Historical Provider, MD  citalopram (CELEXA) 40 MG tablet Take 40 mg by mouth daily.   Yes Historical Provider, MD  diazepam (VALIUM) 10 MG tablet Take 10 mg by mouth 3 (three) times daily.   Yes  Historical Provider, MD  furosemide (LASIX) 20 MG tablet Take 20 mg by mouth every evening.  01/30/12  Yes Jonelle Sidle, MD  insulin glargine (LANTUS) 100 UNIT/ML injection Inject 72 Units into the skin at bedtime.    Yes Historical Provider, MD  lisinopril (PRINIVIL,ZESTRIL) 20 MG tablet Take 20 mg by mouth daily.   Yes Historical Provider, MD  mirtazapine (REMERON) 30 MG tablet Take 30 mg by mouth at bedtime.   Yes Historical Provider, MD  oxyCODONE (OXYCONTIN) 20 MG 12 hr tablet Take 20 mg by mouth 4 (four) times daily. **Take as needed for pain   Yes Historical  Provider, MD  OxyCODONE (OXYCONTIN) 20 mg T12A 12 hr tablet Take 1 tablet (20 mg total) by mouth every 12 (twelve) hours. 09/27/13  Yes Benny Lennert, MD  traZODone (DESYREL) 50 MG tablet Take 50 mg by mouth at bedtime.   Yes Historical Provider, MD    Physical Exam: Filed Vitals:   2013-11-25 2145 11/25/2013 2200 2013/11/25 2209 2013-11-25 2225  BP: 179/81 162/79 171/81   Pulse:  74 74   Temp:   99.3 F (37.4 C) 99.3 F (37.4 C)  TempSrc:   Rectal   Resp:  17 23   Height:      Weight:      SpO2:  95% 96%      General:  Well developed and nourished.  Eyes: Anicteric no pallor.  ENT: No discharge from ears eyes nose mouth.  Neck: No mass felt.  Cardiovascular: S1-S2 heard.  Respiratory: No rhonchi or crepitations.  Abdomen: Soft nontender bowel sounds present.  Skin: No rash. Chronic skin bruises.  Musculoskeletal: No edema.  Psychiatric: Patient is confused.  Neurologic: Patient is confused and moves very minimally on the left side.  Labs on Admission:  Basic Metabolic Panel:  Recent Labs Lab 11-25-2013 2144 25-Nov-2013 2150  NA 138 142  K 3.8 3.9  CL 102 101  CO2 30  --   GLUCOSE 154* 150*  BUN 9 8  CREATININE 0.96 1.20  CALCIUM 9.0  --    Liver Function Tests:  Recent Labs Lab 11-25-2013 2144  AST 18  ALT 13  ALKPHOS 148*  BILITOT 0.5  PROT 6.8  ALBUMIN 3.6   No results found for this basename: LIPASE, AMYLASE,  in the last 168 hours  Recent Labs Lab November 25, 2013 2144  AMMONIA RESULTS UNAVAILABLE DUE TO INTERFERING SUBSTANCE   CBC:  Recent Labs Lab 2013/11/25 2144 11/25/13 2150  WBC 12.2*  --   NEUTROABS 8.6*  --   HGB 17.1* 17.0  HCT 47.8 50.0  MCV 85.8  --   PLT 186  --    Cardiac Enzymes:  Recent Labs Lab 11/25/2013 2144  TROPONINI <0.30    BNP (last 3 results)  Recent Labs  11/11/12 1408  PROBNP 210.4*   CBG:  Recent Labs Lab November 25, 2013 2156  GLUCAP 167*    Radiological Exams on Admission: Ct Head Wo  Contrast  11/25/13   CLINICAL DATA:  Weakness.  Altered mental status.  Fatigue.  EXAM: CT HEAD WITHOUT CONTRAST  TECHNIQUE: Contiguous axial images were obtained from the base of the skull through the vertex without intravenous contrast.  COMPARISON:  09/27/2013  FINDINGS: Prior right side at occipital, temporal, and parietal lobe infarcts with chronic dystrophic calcification along the superior infarct margin.  Periventricular white matter and corona radiata hypodensities favor chronic ischemic microvascular white matter disease.  No acute intracranial findings. No intracranial hemorrhage, mass  lesion, or acute CVA.  IMPRESSION: 1. Old right-sided infarcts in the occipital, parietal, and temporal lobes. No acute intracranial findings.   Electronically Signed   By: Herbie Baltimore M.D.   On: 11/03/2013 22:09      Assessment/Plan Principal Problem:   Seizure Active Problems:   UNSPECIFIED SECONDARY CARDIOMYOPATHY   CVA WITH LEFT HEMIPARESIS   ICD-St.Jude   Type 2 diabetes mellitus   Seizures   1. Possible seizures - patient has been started on Keppra loading dose and placed on 500 mg twice a day by the neurologist. EEG has been ordered. 2. Acute encephalopathy - possibly post ictal. Closely follow. 3. Mild leukocytosis - probably reactionary. Follow chest x-ray and UA. 4. Diabetes mellitus type 2 - patient is on Lantus. Closely follow CBGs. For now I have ordered CBG every 4 hourly. 5. History of nonischemic cardiomyopathy status post AICD placement - closely follow intake output and metabolic panel. 6. History of CVA with left-sided hemiparesis - on aspirin. 7. Chronic pain - we'll continue home medications.    Code Status: Full code.  Family Communication: Patient's son at the bedside.  Disposition Plan: Admit to inpatient.    Mylinda Brook N. [facility] Pager [phone]..  If 7PM-7AM, please contact night-coverage [email or web address] Password [email or web address] 10/25/2013, 11:52  PM

## 2013-10-31 ENCOUNTER — Inpatient Hospital Stay (HOSPITAL_COMMUNITY): Payer: Medicare Other

## 2013-10-31 DIAGNOSIS — G9340 Encephalopathy, unspecified: Secondary | ICD-10-CM

## 2013-10-31 DIAGNOSIS — I10 Essential (primary) hypertension: Secondary | ICD-10-CM

## 2013-10-31 DIAGNOSIS — R569 Unspecified convulsions: Secondary | ICD-10-CM

## 2013-10-31 LAB — URINALYSIS, ROUTINE W REFLEX MICROSCOPIC
Glucose, UA: NEGATIVE mg/dL
Hgb urine dipstick: NEGATIVE
Nitrite: NEGATIVE
Protein, ur: NEGATIVE mg/dL
Specific Gravity, Urine: 1.011 (ref 1.005–1.030)
Urobilinogen, UA: 1 mg/dL (ref 0.0–1.0)

## 2013-10-31 LAB — GLUCOSE, CAPILLARY
Glucose-Capillary: 111 mg/dL — ABNORMAL HIGH (ref 70–99)
Glucose-Capillary: 142 mg/dL — ABNORMAL HIGH (ref 70–99)
Glucose-Capillary: 147 mg/dL — ABNORMAL HIGH (ref 70–99)
Glucose-Capillary: 152 mg/dL — ABNORMAL HIGH (ref 70–99)
Glucose-Capillary: 157 mg/dL — ABNORMAL HIGH (ref 70–99)

## 2013-10-31 LAB — RAPID URINE DRUG SCREEN, HOSP PERFORMED
Amphetamines: NOT DETECTED
Barbiturates: NOT DETECTED
Opiates: NOT DETECTED
Tetrahydrocannabinol: NOT DETECTED

## 2013-10-31 LAB — CBC WITH DIFFERENTIAL/PLATELET
Basophils Absolute: 0 10*3/uL (ref 0.0–0.1)
Basophils Relative: 0 % (ref 0–1)
Eosinophils Absolute: 0.4 10*3/uL (ref 0.0–0.7)
MCH: 29.8 pg (ref 26.0–34.0)
MCHC: 34.8 g/dL (ref 30.0–36.0)
Neutrophils Relative %: 65 % (ref 43–77)
Platelets: 176 10*3/uL (ref 150–400)
RDW: 13.2 % (ref 11.5–15.5)

## 2013-10-31 LAB — COMPREHENSIVE METABOLIC PANEL
ALT: 12 U/L (ref 0–53)
AST: 15 U/L (ref 0–37)
Alkaline Phosphatase: 135 U/L — ABNORMAL HIGH (ref 39–117)
CO2: 24 mEq/L (ref 19–32)
Chloride: 103 mEq/L (ref 96–112)
Creatinine, Ser: 0.89 mg/dL (ref 0.50–1.35)
GFR calc non Af Amer: >90 mL/min (ref >90)
Sodium: 139 mEq/L (ref 135–145)
Total Bilirubin: 0.4 mg/dL (ref 0.3–1.2)
Total Protein: 6.5 g/dL (ref 6.0–8.3)

## 2013-10-31 MED ORDER — FUROSEMIDE 20 MG PO TABS
20.0000 mg | ORAL_TABLET | Freq: Every evening | ORAL | Status: DC
  Administered 2013-10-31: 20 mg via ORAL
  Filled 2013-10-31 (×3): qty 1

## 2013-10-31 MED ORDER — CITALOPRAM HYDROBROMIDE 40 MG PO TABS
40.0000 mg | ORAL_TABLET | Freq: Every day | ORAL | Status: DC
  Administered 2013-10-31 – 2013-11-01 (×2): 40 mg via ORAL
  Filled 2013-10-31 (×3): qty 1

## 2013-10-31 MED ORDER — OXYCODONE HCL ER 10 MG PO T12A
20.0000 mg | EXTENDED_RELEASE_TABLET | Freq: Two times a day (BID) | ORAL | Status: DC
  Administered 2013-10-31 – 2013-11-01 (×3): 20 mg via ORAL
  Filled 2013-10-31 (×3): qty 2

## 2013-10-31 MED ORDER — TRAZODONE HCL 50 MG PO TABS
50.0000 mg | ORAL_TABLET | Freq: Every day | ORAL | Status: DC
  Administered 2013-10-31: 50 mg via ORAL
  Filled 2013-10-31 (×2): qty 1

## 2013-10-31 MED ORDER — SODIUM CHLORIDE 0.9 % IV SOLN
INTRAVENOUS | Status: DC
  Administered 2013-10-31: 20 mL/h via INTRAVENOUS

## 2013-10-31 MED ORDER — INSULIN GLARGINE 100 UNIT/ML ~~LOC~~ SOLN
72.0000 [IU] | Freq: Every day | SUBCUTANEOUS | Status: DC
  Administered 2013-10-31 – 2013-11-01 (×2): 72 [IU] via SUBCUTANEOUS
  Filled 2013-10-31 (×3): qty 0.72

## 2013-10-31 MED ORDER — DIAZEPAM 5 MG PO TABS
10.0000 mg | ORAL_TABLET | Freq: Three times a day (TID) | ORAL | Status: DC
  Administered 2013-10-31 – 2013-11-01 (×4): 10 mg via ORAL
  Filled 2013-10-31 (×4): qty 2

## 2013-10-31 MED ORDER — ALBUTEROL SULFATE (5 MG/ML) 0.5% IN NEBU: 2.5000 mg | INHALATION_SOLUTION | RESPIRATORY_TRACT | Status: DC | PRN

## 2013-10-31 MED ORDER — INFLUENZA VAC SPLIT QUAD 0.5 ML IM SUSP
0.5000 mL | INTRAMUSCULAR | Status: AC
  Filled 2013-10-31 (×2): qty 0.5

## 2013-10-31 MED ORDER — PNEUMOCOCCAL VAC POLYVALENT 25 MCG/0.5ML IJ INJ
0.5000 mL | INJECTION | INTRAMUSCULAR | Status: DC
  Filled 2013-10-31: qty 0.5

## 2013-10-31 MED ORDER — LORAZEPAM 2 MG/ML IJ SOLN
1.0000 mg | INTRAMUSCULAR | Status: DC | PRN
  Administered 2013-11-03: 1 mg via INTRAVENOUS
  Filled 2013-10-31: qty 1

## 2013-10-31 MED ORDER — ONDANSETRON HCL 4 MG PO TABS: 4.0000 mg | ORAL_TABLET | Freq: Four times a day (QID) | ORAL | Status: DC | PRN

## 2013-10-31 MED ORDER — LISINOPRIL 20 MG PO TABS
20.0000 mg | ORAL_TABLET | Freq: Every day | ORAL | Status: DC
  Administered 2013-10-31 – 2013-11-01 (×2): 20 mg via ORAL
  Filled 2013-10-31 (×3): qty 1

## 2013-10-31 MED ORDER — SODIUM CHLORIDE 0.9 % IJ SOLN
3.0000 mL | Freq: Two times a day (BID) | INTRAMUSCULAR | Status: DC
  Administered 2013-10-31 – 2013-11-03 (×5): 3 mL via INTRAVENOUS

## 2013-10-31 MED ORDER — CARVEDILOL 25 MG PO TABS
25.0000 mg | ORAL_TABLET | Freq: Two times a day (BID) | ORAL | Status: DC
  Administered 2013-10-31 – 2013-11-01 (×3): 25 mg via ORAL
  Filled 2013-10-31 (×7): qty 1

## 2013-10-31 MED ORDER — ONDANSETRON HCL 4 MG/2ML IJ SOLN: 4.0000 mg | Freq: Four times a day (QID) | INTRAMUSCULAR | Status: DC | PRN

## 2013-10-31 MED ORDER — MIRTAZAPINE 30 MG PO TABS
30.0000 mg | ORAL_TABLET | Freq: Every day | ORAL | Status: DC
  Administered 2013-10-31: 30 mg via ORAL
  Filled 2013-10-31 (×3): qty 1

## 2013-10-31 MED ORDER — ACETAMINOPHEN 325 MG PO TABS: 650.0000 mg | ORAL_TABLET | Freq: Four times a day (QID) | ORAL | Status: DC | PRN

## 2013-10-31 MED ORDER — ENOXAPARIN SODIUM 40 MG/0.4ML ~~LOC~~ SOLN
40.0000 mg | SUBCUTANEOUS | Status: DC
  Administered 2013-10-31 – 2013-11-03 (×4): 40 mg via SUBCUTANEOUS
  Filled 2013-10-31 (×4): qty 0.4

## 2013-10-31 MED ORDER — ASPIRIN EC 81 MG PO TBEC
81.0000 mg | DELAYED_RELEASE_TABLET | Freq: Every day | ORAL | Status: DC
  Administered 2013-10-31 – 2013-11-01 (×2): 81 mg via ORAL
  Filled 2013-10-31 (×2): qty 1

## 2013-10-31 MED ORDER — INSULIN ASPART 100 UNIT/ML ~~LOC~~ SOLN
0.0000 [IU] | Freq: Three times a day (TID) | SUBCUTANEOUS | Status: DC
  Administered 2013-10-31: 2 [IU] via SUBCUTANEOUS
  Administered 2013-10-31 – 2013-11-02 (×3): 1 [IU] via SUBCUTANEOUS

## 2013-10-31 MED ORDER — ACETAMINOPHEN 650 MG RE SUPP
650.0000 mg | Freq: Four times a day (QID) | RECTAL | Status: DC | PRN
  Administered 2013-11-01: 650 mg via RECTAL
  Filled 2013-10-31: qty 1

## 2013-10-31 MED ORDER — HYDRALAZINE HCL 20 MG/ML IJ SOLN: 10.0000 mg | Freq: Four times a day (QID) | INTRAMUSCULAR | Status: DC | PRN

## 2013-10-31 NOTE — Procedures (Signed)
ELECTROENCEPHALOGRAM REPORT   Patient: Peter Long       Room #: 1Y78 EEG No. ID: [id number] Age: 57 y.o.        Sex: male Referring Physician: Benjamine Mola Report Date:  10/31/2013        Interpreting Physician: Aline Brochure  History: [name] is an 57 y.o. male he was admitted following an episode of stiffening of his left extremities and altered mental status, consistent with seizure activity.  Indications for study:  Rule out new onset seizure disorder.  Technique: This is an 18 channel routine scalp EEG performed at the bedside with bipolar and monopolar montages arranged in accordance to the international 10/20 system of electrode placement.   Description: This EEG recording was performed during wakefulness. Background activity consisted of 1-2 Hz low amplitude diffuse delta activity with superimposed 7-8 Hz rhythmic activity recorded from the posterior head regions, as well as superimposed diffuse low amplitude beta activity. Photic stimulation was not performed. Hyperventilation was not performed. No epileptiform discharges were recorded.  Interpretation: CT is abnormal with mild generalized nonspecific continuous slowing of cerebral activity. This pattern of slowing can be seen with metabolic as well as degenerative central nervous system disorders. No evidence of an epileptic disorder was demonstrated.   Venetia Maxon M.D. [facility] [phone]

## 2013-10-31 NOTE — Progress Notes (Signed)
[facility] PROGRESS NOTE  Peter Long ZOX:[id number] DOB: [birth year] DOA: 10/22/2013 PCP: Kirk Ruths, MD  Assessment/Plan: Possible seizures - patient has been started on Keppra loading dose and placed on 500 mg twice a day by the neurologist. EEG has been ordered. -repeat head CT in AM  Acute encephalopathy - possibly post ictal. Closely follow.   Mild leukocytosis - probably reactionary  Diabetes mellitus type 2 - patient is on Lantus. Closely follow CBGs.    History of nonischemic cardiomyopathy status post AICD placement - closely follow intake output and metabolic panel.   History of CVA with left-sided hemiparesis - on aspirin.   Chronic pain - we'll continue home medications   Code Status: full Family Communication:  Disposition Plan:    Consultants:  neuro  Procedures:  EEg  Antibiotics:    HPI/Subjective: Awake- knows who and where he is but not year  Objective: Filed Vitals:   10/31/13 0921  BP: 182/82  Pulse:   Temp:   Resp:     Intake/Output Summary (Last 24 hours) at 10/31/13 1218 Last data filed at 10/31/13 0834  Gross per 24 hour  Intake      0 ml  Output    450 ml  Net   -450 ml   Filed Weights   11/20/2013 2054 10/31/13 0219 10/31/13 0537  Weight: 112.946 kg (249 lb) 108.863 kg (240 lb) 108.999 kg (240 lb 4.8 oz)    Exam:  General: chronically ill appearing  Cardiovascular: S1-S2 heard.  Respiratory: No rhonchi or crepitations.  Abdomen: Soft nontender bowel sounds present.  Skin: No rash. Chronic skin bruises.  Musculoskeletal: No edema.  Psychiatric: Patient is confused.  Neurologic: oriented to person, place, time   Data Reviewed: Basic Metabolic Panel:  Recent Labs Lab 10/31/2013 2144 11/01/2013 2150 10/31/13 0551  NA 138 142 139  K 3.8 3.9 3.7  CL 102 101 103  CO2 30  --  24  GLUCOSE 154* 150* 140*  BUN 9 8 9   CREATININE 0.96 1.20 0.89  CALCIUM 9.0  --  8.9   Liver Function Tests:  Recent  Labs Lab 10/31/2013 2144 10/31/13 0551  AST 18 15  ALT 13 12  ALKPHOS 148* 135*  BILITOT 0.5 0.4  PROT 6.8 6.5  ALBUMIN 3.6 3.3*   No results found for this basename: LIPASE, AMYLASE,  in the last 168 hours  Recent Labs Lab 11/03/2013 2144  AMMONIA RESULTS UNAVAILABLE DUE TO INTERFERING SUBSTANCE   CBC:  Recent Labs Lab 11/16/2013 2144 11/07/2013 2150 10/31/13 0551  WBC 12.2*  --  10.9*  NEUTROABS 8.6*  --  7.1  HGB 17.1* 17.0 16.0  HCT 47.8 50.0 46.0  MCV 85.8  --  85.7  PLT 186  --  176   Cardiac Enzymes:  Recent Labs Lab 11/16/2013 2144  TROPONINI <0.30   BNP (last 3 results)  Recent Labs  11/11/12 1408  PROBNP 210.4*   CBG:  Recent Labs Lab 10/26/2013 2156 10/31/13 0655 10/31/13 1115  GLUCAP 167* 147* 157*    No results found for this or any previous visit (from the past 240 hour(s)).   Studies: Ct Head Wo Contrast  11/17/2013   CLINICAL DATA:  Weakness.  Altered mental status.  Fatigue.  EXAM: CT HEAD WITHOUT CONTRAST  TECHNIQUE: Contiguous axial images were obtained from the base of the skull through the vertex without intravenous contrast.  COMPARISON:  09/27/2013  FINDINGS: Prior right side at occipital, temporal, and parietal lobe infarcts  with chronic dystrophic calcification along the superior infarct margin.  Periventricular white matter and corona radiata hypodensities favor chronic ischemic microvascular white matter disease.  No acute intracranial findings. No intracranial hemorrhage, mass lesion, or acute CVA.  IMPRESSION: 1. Old right-sided infarcts in the occipital, parietal, and temporal lobes. No acute intracranial findings.   Electronically Signed   By: Herbie Baltimore M.D.   On: 10/26/2013 22:09   Dg Chest Port 1 View  10/31/2013   CLINICAL DATA:  Weakness; altered mental status.  EXAM: PORTABLE CHEST - 1 VIEW  COMPARISON:  Chest radiograph performed 11/11/2012  FINDINGS: The lungs are well-aerated. Mild right basilar atelectasis is noted.  There is no evidence of pleural effusion or pneumothorax. There is mild elevation of the right hemidiaphragm. A nodular density at the left lung base appears stable from 2013 and is likely benign; it may reflect a small calcified granuloma or possibly the nipple shadow.  The cardiomediastinal silhouette is normal in size. An AICD is noted overlying the right chest wall, with a single lead ending overlying the right ventricle. No acute osseous abnormalities are seen. Healed bilateral rib fractures are seen.  IMPRESSION: Mild right basilar atelectasis noted, with mild elevation the right hemidiaphragm. Lungs otherwise clear.   Electronically Signed   By: Roanna Raider M.D.   On: 10/31/2013 00:37    Scheduled Meds: . aspirin EC  81 mg Oral Daily  . carvedilol  25 mg Oral BID WC  . citalopram  40 mg Oral Daily  . diazepam  10 mg Oral TID  . enoxaparin (LOVENOX) injection  40 mg Subcutaneous Q24H  . furosemide  20 mg Oral QPM  . insulin aspart  0-9 Units Subcutaneous TID WC  . insulin glargine  72 Units Subcutaneous QHS  . levETIRAcetam  500 mg Oral BID  . lisinopril  20 mg Oral Daily  . mirtazapine  30 mg Oral QHS  . OxyCODONE  20 mg Oral Q12H  . sodium chloride  3 mL Intravenous Q12H  . traZODone  50 mg Oral QHS   Continuous Infusions: . sodium chloride 20 mL/hr (10/31/13 0433)    Principal Problem:   Seizure Active Problems:   UNSPECIFIED SECONDARY CARDIOMYOPATHY   CVA WITH LEFT HEMIPARESIS   ICD-St.Jude   Type 2 diabetes mellitus   Seizures    Time spent: 35 min    Abimelec Grochowski  [facility] Pager [phone]. If 7PM-7AM, please contact night-coverage at [email or web address], password [facility] 10/31/2013, 12:18 PM  LOS: 1 day

## 2013-10-31 NOTE — Progress Notes (Signed)
Routine EEG completed.  

## 2013-10-31 NOTE — Progress Notes (Signed)
UR complete.  Sabra Sessler RN, MSN 

## 2013-10-31 NOTE — Progress Notes (Addendum)
NEURO HOSPITALIST PROGRESS NOTE   SUBJECTIVE:                                                                                                                        Patient has no complaints. Feels that he is "groggy" but is aware he is in the hospital.  Cannot give me the reason he was brought to the hospital and remains confused.  He is able to follow command.   OBJECTIVE:                                                                                                                           Vital signs in last 24 hours: Temp:  [97.9 F (36.6 C)-99.3 F (37.4 C)] 98.4 F (36.9 C) (12/11 0920) Pulse Rate:  [70-89] 89 (12/11 0920) Resp:  [12-23] 18 (12/11 0920) BP: (147-182)/(72-118) 182/82 mmHg (12/11 0921) SpO2:  [92 %-100 %] 98 % (12/11 0920) Weight:  [108.863 kg (240 lb)-112.946 kg (249 lb)] 108.999 kg (240 lb 4.8 oz) (12/11 0537)  Intake/Output from previous day:   Intake/Output this shift: Total I/O In: -  Out: 450 [Urine:450] Nutritional status: Carb Control  Past Medical History  Diagnosis Date  . Type 2 diabetes mellitus   . Hyperlipidemia   . Essential hypertension, benign   . Coronary artery disease     Possible - poorly documented  . Chronic pain   . Diverticulitis   . COPD (chronic obstructive pulmonary disease)   . Anxiety   . Depression   . Cerebrovascular disease   . Nonischemic cardiomyopathy     LVEF 30-35% 5/11  . MRSA (methicillin resistant Staphylococcus aureus) septicemia   . Diabetes mellitus   . Arthritis   . Stroke     residual left hemiplegia, left visual field deficit  . CHF (congestive heart failure)      Neurologic Exam:  Mental Status: Alert, oriented, slow mentation and seems to star forward--but when asked to look at me he will do so very quickly.  He answers my questions and follolws commands but everything takes a prolonged period of time. Marland Kitchen  Speech fluent without evidence of aphasia.    Cranial Nerves: II:  Visual fields shows a left hemianopsia, pupils  equal, round, reactive to light and accommodation III,IV, VI: ptosis not present, extra-ocular motions intact bilaterally V,VII: smile symmetric, facial light touch sensation normal bilaterally VIII: hearing normal bilaterally IX,X: gag reflex present XI: bilateral shoulder shrug XII: midline tongue extension without atrophy or fasciculations  Motor: Right : Upper extremity   5/5    Left:     Upper extremity   3/5  Lower extremity   5/5     Lower extremity   5/5 Tone and bulk:normal tone throughout; no atrophy noted Sensory: Pinprick and light touch intact throughout,but decreased on the left arm and leg and shows neglect of left arm and leg.  Deep Tendon Reflexes:  Right: Upper Extremity   Left: Upper extremity   biceps (C-5 to C-6) 2/4   biceps (C-5 to C-6) 2/4 tricep (C7) 2/4    triceps (C7) 2/4 Brachioradialis (C6) 2/4  Brachioradialis (C6) 2/4  Lower Extremity Lower Extremity  quadriceps (L-2 to L-4) 2/4   quadriceps (L-2 to L-4) 2/4 Achilles (S1) 1/4   Achilles (S1) 1/4  Plantars: Right: downgoing   Left: downgoing Cerebellar: normal finger-to-nose on the right but ataxic on the left due to weakness,  normal heel-to-shin test     Lab Results: Lab Results  Component Value Date/Time   CHOL 167 03/27/2012  6:03 AM   Lipid Panel No results found for this basename: CHOL, TRIG, HDL, CHOLHDL, VLDL, LDLCALC,  in the last 72 hours  Studies/Results: Ct Head Wo Contrast  November 06, 2013   CLINICAL DATA:  Weakness.  Altered mental status.  Fatigue.  EXAM: CT HEAD WITHOUT CONTRAST  TECHNIQUE: Contiguous axial images were obtained from the base of the skull through the vertex without intravenous contrast.  COMPARISON:  09/27/2013  FINDINGS: Prior right side at occipital, temporal, and parietal lobe infarcts with chronic dystrophic calcification along the superior infarct margin.  Periventricular white matter and corona  radiata hypodensities favor chronic ischemic microvascular white matter disease.  No acute intracranial findings. No intracranial hemorrhage, mass lesion, or acute CVA.  IMPRESSION: 1. Old right-sided infarcts in the occipital, parietal, and temporal lobes. No acute intracranial findings.   Electronically Signed   By: Herbie Baltimore M.D.   On: 2013-11-06 22:09   Dg Chest Port 1 View  10/31/2013   CLINICAL DATA:  Weakness; altered mental status.  EXAM: PORTABLE CHEST - 1 VIEW  COMPARISON:  Chest radiograph performed 11/11/2012  FINDINGS: The lungs are well-aerated. Mild right basilar atelectasis is noted. There is no evidence of pleural effusion or pneumothorax. There is mild elevation of the right hemidiaphragm. A nodular density at the left lung base appears stable from 2013 and is likely benign; it may reflect a small calcified granuloma or possibly the nipple shadow.  The cardiomediastinal silhouette is normal in size. An AICD is noted overlying the right chest wall, with a single lead ending overlying the right ventricle. No acute osseous abnormalities are seen. Healed bilateral rib fractures are seen.  IMPRESSION: Mild right basilar atelectasis noted, with mild elevation the right hemidiaphragm. Lungs otherwise clear.   Electronically Signed   By: Roanna Raider M.D.   On: 10/31/2013 00:37    MEDICATIONS  Scheduled: . aspirin EC  81 mg Oral Daily  . carvedilol  25 mg Oral BID WC  . citalopram  40 mg Oral Daily  . diazepam  10 mg Oral TID  . enoxaparin (LOVENOX) injection  40 mg Subcutaneous Q24H  . furosemide  20 mg Oral QPM  . insulin aspart  0-9 Units Subcutaneous TID WC  . insulin glargine  72 Units Subcutaneous QHS  . levETIRAcetam  500 mg Oral BID  . lisinopril  20 mg Oral Daily  . mirtazapine  30 mg Oral QHS  . OxyCODONE  20 mg Oral Q12H  . sodium chloride  3 mL  Intravenous Q12H  . traZODone  50 mg Oral QHS   Interpretation: EEG is abnormal with mild generalized nonspecific continuous slowing of cerebral activity. This pattern of slowing can be seen with metabolic as well as degenerative central nervous system disorders. No evidence of an epileptic disorder was demonstrated.     ASSESSMENT/PLAN:                                                                                                            57 yo M with episode of left arm and leg tightening, incontenance most consistent with seizure. Patient remains to have slow mentation and confusion.  EEG was obtained earlier today --and showed no epileptiform activity. HE remains to have weakness on the left arm and leg but states this is his baseline but feels his decreased vision on the left is new (however is is noted in history to be old).   Recommend: 1) Continue Keppra 500 mg BID 2) Repeat CT head tomorrow AM 3) Continue ASA 4) Continue BP control with goal systolic <160 at this point  Assessment and plan discussed with with attending physician and they are in agreement.    Felicie Morn PA-C [facility] [phone]  10/31/2013, 12:05 PM

## 2013-11-01 ENCOUNTER — Inpatient Hospital Stay (HOSPITAL_COMMUNITY): Payer: Medicare Other

## 2013-11-01 DIAGNOSIS — I517 Cardiomegaly: Secondary | ICD-10-CM

## 2013-11-01 LAB — BLOOD GAS, ARTERIAL
Acid-Base Excess: 1.5 mmol/L (ref 0.0–2.0)
Bicarbonate: 25.2 mEq/L — ABNORMAL HIGH (ref 20.0–24.0)
O2 Saturation: 93.9 %
TCO2: 26.4 mmol/L (ref 0–100)
pCO2 arterial: 37.3 mmHg (ref 35.0–45.0)
pH, Arterial: 7.445 (ref 7.350–7.450)
pO2, Arterial: 65.8 mmHg — ABNORMAL LOW (ref 80.0–100.0)

## 2013-11-01 LAB — CBC
HCT: 46.1 % (ref 39.0–52.0)
MCH: 29.9 pg (ref 26.0–34.0)
MCHC: 35.6 g/dL (ref 30.0–36.0)
MCV: 84 fL (ref 78.0–100.0)
RDW: 12.8 % (ref 11.5–15.5)
WBC: 13.4 10*3/uL — ABNORMAL HIGH (ref 4.0–10.5)

## 2013-11-01 LAB — GLUCOSE, CAPILLARY
Glucose-Capillary: 100 mg/dL — ABNORMAL HIGH (ref 70–99)
Glucose-Capillary: 109 mg/dL — ABNORMAL HIGH (ref 70–99)

## 2013-11-01 LAB — COMPREHENSIVE METABOLIC PANEL
BUN: 8 mg/dL (ref 6–23)
CO2: 25 mEq/L (ref 19–32)
Calcium: 9.2 mg/dL (ref 8.4–10.5)
Chloride: 101 mEq/L (ref 96–112)
Creatinine, Ser: 0.92 mg/dL (ref 0.50–1.35)
GFR calc non Af Amer: >90 mL/min (ref >90)
Sodium: 137 mEq/L (ref 135–145)
Total Bilirubin: 0.6 mg/dL (ref 0.3–1.2)

## 2013-11-01 MED ORDER — DIAZEPAM 5 MG PO TABS: 5.0000 mg | ORAL_TABLET | Freq: Three times a day (TID) | ORAL | Status: DC

## 2013-11-01 MED ORDER — FUROSEMIDE 10 MG/ML IJ SOLN
40.0000 mg | Freq: Once | INTRAMUSCULAR | Status: AC
  Administered 2013-11-01: 40 mg via INTRAVENOUS
  Filled 2013-11-01 (×2): qty 4

## 2013-11-01 MED ORDER — OXYCODONE HCL ER 10 MG PO T12A: 10.0000 mg | EXTENDED_RELEASE_TABLET | Freq: Two times a day (BID) | ORAL | Status: DC

## 2013-11-01 MED ORDER — OXYCODONE HCL 5 MG PO TABS: 5.0000 mg | ORAL_TABLET | Freq: Four times a day (QID) | ORAL | Status: DC | PRN

## 2013-11-01 MED ORDER — TRAZODONE HCL 50 MG PO TABS
50.0000 mg | ORAL_TABLET | Freq: Every evening | ORAL | Status: DC | PRN
  Filled 2013-11-01: qty 1

## 2013-11-01 MED ORDER — ALBUTEROL SULFATE (5 MG/ML) 0.5% IN NEBU
2.5000 mg | INHALATION_SOLUTION | RESPIRATORY_TRACT | Status: DC | PRN
  Administered 2013-11-01 (×2): 2.5 mg via RESPIRATORY_TRACT
  Filled 2013-11-01 (×2): qty 0.5

## 2013-11-01 MED ORDER — OXYCODONE HCL ER 15 MG PO T12A: 15.0000 mg | EXTENDED_RELEASE_TABLET | Freq: Two times a day (BID) | ORAL | Status: DC

## 2013-11-01 MED ORDER — PHENYTOIN SODIUM 50 MG/ML IJ SOLN
100.0000 mg | Freq: Three times a day (TID) | INTRAMUSCULAR | Status: DC
  Administered 2013-11-01 – 2013-11-04 (×9): 100 mg via INTRAVENOUS
  Filled 2013-11-01 (×13): qty 2

## 2013-11-01 MED ORDER — CLOPIDOGREL BISULFATE 75 MG PO TABS
75.0000 mg | ORAL_TABLET | Freq: Every day | ORAL | Status: DC
  Filled 2013-11-01: qty 1

## 2013-11-01 NOTE — Progress Notes (Signed)
Called by nursing  That patient was more lethargic- d/c valium and decreased pain meds. Ordered ABG and chest x ray.  Notified Neuro (dr. Roseanne Reno) of positive CT scan- continue stroke work up.   Depending on results of ABG and x ray, may need transfer to SDU and NG placed for medications.  Wife updated on phone.  Marlin Canary DO

## 2013-11-01 NOTE — Progress Notes (Addendum)
[facility] PROGRESS NOTE  Peter Long UJW:[id number] DOB: [birth year] DOA: 11/03/2013 PCP: Kirk Ruths, MD  Assessment/Plan: Possible seizures - patient has been started on Keppra loading dose and placed on 500 mg twice a day by the neurologist. EEG has been ordered but did not show seizres. -repeat head CT : positive for CVA  Acute encephalopathy - possibly post ictal. Closely follow.   Mild leukocytosis - trend, U/A and chest x ray not representative of infection  Diabetes mellitus type 2 - patient is on Lantus. Closely follow CBGs.    History of nonischemic cardiomyopathy status post AICD placement - closely follow intake output and metabolic panel.   History of CVA with left-sided hemiparesis - on aspirin.  -check swallowing  Chronic pain - we'll continue home medications but at a decreased dose   Code Status: full Family Communication: son at bedside Disposition Plan:    Consultants:  neuro  Procedures:  EEg  Antibiotics:    HPI/Subjective: Harder to wake up today C/o a film over his eyes -nurse reports cough with swallowing   Objective: Filed Vitals:   11/01/13 1030  BP: 144/80  Pulse: 68  Temp: 98.4 F (36.9 C)  Resp: 18    Intake/Output Summary (Last 24 hours) at 11/01/13 1122 Last data filed at 10/31/13 2135  Gross per 24 hour  Intake    390 ml  Output    327 ml  Net     63 ml   Filed Weights   10/31/13 0219 10/31/13 0537 11/01/13 0500  Weight: 108.863 kg (240 lb) 108.999 kg (240 lb 4.8 oz) 109.77 kg (242 lb)    Exam:  General: chronically ill appearing  Cardiovascular: S1-S2 heard.  Respiratory: coarse breath sounds  Abdomen: Soft nontender bowel sounds present.  Skin: No rash. Chronic skin bruises.  Musculoskeletal: No edema.  Psychiatric: Patient is confused.  Neurologic: oriented to person, place, time   Data Reviewed: Basic Metabolic Panel:  Recent Labs Lab 11/18/2013 2144 10/25/2013 2150 10/31/13 0551  11/01/13 0626  NA 138 142 139 137  K 3.8 3.9 3.7 3.7  CL 102 101 103 101  CO2 30  --  24 25  GLUCOSE 154* 150* 140* 109*  BUN 9 8 9 8   CREATININE 0.96 1.20 0.89 0.92  CALCIUM 9.0  --  8.9 9.2   Liver Function Tests:  Recent Labs Lab 10/25/2013 2144 10/31/13 0551 11/01/13 0626  AST 18 15 15   ALT 13 12 11   ALKPHOS 148* 135* 136*  BILITOT 0.5 0.4 0.6  PROT 6.8 6.5 6.8  ALBUMIN 3.6 3.3* 3.6   No results found for this basename: LIPASE, AMYLASE,  in the last 168 hours  Recent Labs Lab 11/03/2013 2144 10/31/13 1330  AMMONIA RESULTS UNAVAILABLE DUE TO INTERFERING SUBSTANCE 36   CBC:  Recent Labs Lab 10/28/2013 2144 11/03/2013 2150 10/31/13 0551 11/01/13 0626  WBC 12.2*  --  10.9* 13.4*  NEUTROABS 8.6*  --  7.1  --   HGB 17.1* 17.0 16.0 16.4  HCT 47.8 50.0 46.0 46.1  MCV 85.8  --  85.7 84.0  PLT 186  --  176 180   Cardiac Enzymes:  Recent Labs Lab 11/07/2013 2144  TROPONINI <0.30   BNP (last 3 results)  Recent Labs  11/11/12 1408  PROBNP 210.4*   CBG:  Recent Labs Lab 10/31/13 1639 10/31/13 2122 10/31/13 2333 11/01/13 0436 11/01/13 0834  GLUCAP 111* 142* 152* 112* 123*    No results found for this or  any previous visit (from the past 240 hour(s)).   Studies: Ct Head Wo Contrast  10/26/2013   CLINICAL DATA:  Weakness.  Altered mental status.  Fatigue.  EXAM: CT HEAD WITHOUT CONTRAST  TECHNIQUE: Contiguous axial images were obtained from the base of the skull through the vertex without intravenous contrast.  COMPARISON:  09/27/2013  FINDINGS: Prior right side at occipital, temporal, and parietal lobe infarcts with chronic dystrophic calcification along the superior infarct margin.  Periventricular white matter and corona radiata hypodensities favor chronic ischemic microvascular white matter disease.  No acute intracranial findings. No intracranial hemorrhage, mass lesion, or acute CVA.  IMPRESSION: 1. Old right-sided infarcts in the occipital, parietal, and  temporal lobes. No acute intracranial findings.   Electronically Signed   By: Herbie Baltimore M.D.   On: 11/15/2013 22:09   Dg Chest Port 1 View  10/31/2013   CLINICAL DATA:  Weakness; altered mental status.  EXAM: PORTABLE CHEST - 1 VIEW  COMPARISON:  Chest radiograph performed 11/11/2012  FINDINGS: The lungs are well-aerated. Mild right basilar atelectasis is noted. There is no evidence of pleural effusion or pneumothorax. There is mild elevation of the right hemidiaphragm. A nodular density at the left lung base appears stable from 2013 and is likely benign; it may reflect a small calcified granuloma or possibly the nipple shadow.  The cardiomediastinal silhouette is normal in size. An AICD is noted overlying the right chest wall, with a single lead ending overlying the right ventricle. No acute osseous abnormalities are seen. Healed bilateral rib fractures are seen.  IMPRESSION: Mild right basilar atelectasis noted, with mild elevation the right hemidiaphragm. Lungs otherwise clear.   Electronically Signed   By: Roanna Raider M.D.   On: 10/31/2013 00:37    Scheduled Meds: . aspirin EC  81 mg Oral Daily  . carvedilol  25 mg Oral BID WC  . citalopram  40 mg Oral Daily  . diazepam  10 mg Oral TID  . enoxaparin (LOVENOX) injection  40 mg Subcutaneous Q24H  . furosemide  20 mg Oral QPM  . influenza vac split quadrivalent PF  0.5 mL Intramuscular Tomorrow-1000  . insulin aspart  0-9 Units Subcutaneous TID WC  . insulin glargine  72 Units Subcutaneous QHS  . levETIRAcetam  500 mg Oral BID  . lisinopril  20 mg Oral Daily  . mirtazapine  30 mg Oral QHS  . OxyCODONE  20 mg Oral Q12H  . pneumococcal 23 valent vaccine  0.5 mL Intramuscular Tomorrow-1000  . sodium chloride  3 mL Intravenous Q12H  . traZODone  50 mg Oral QHS   Continuous Infusions: . sodium chloride 20 mL/hr (10/31/13 0433)    Principal Problem:   Seizure Active Problems:   UNSPECIFIED SECONDARY CARDIOMYOPATHY   CVA WITH  LEFT HEMIPARESIS   ICD-St.[employer]ude   Type 2 diabetes mellitus   Seizures    Time spent: 35 min    Peter Long  [facility] Pager [phone]. If 7PM-7AM, please contact night-coverage at [email or web address], password [facility] 11/01/2013, 11:22 AM  LOS: 2 days

## 2013-11-01 NOTE — Progress Notes (Signed)
At 1730, pt was staring to left side and when asked to look to the right he was very slow to track eyes to the right.  He was also unable to squeeze with right hand or raise his right arm; though he did move the bed sheet slightly with right hand.  Dr. Benjamine Mola notified, orders received.  After abg and chest x ray are completed, MD to evaluate need for further orders.  Nsg to continue to monitor.

## 2013-11-01 NOTE — Progress Notes (Signed)
Called by bedside RN regarding patient's complaint of shortness of breath. Upon arrival to room patient sitting upright in bed, diaphoretic 4L [place] sats 96%, HR 78, SBP 180s. Rectal temp obtained, 100.9. Tylenol PR given per orders. Chest xray and ABG and been drawn around 7pm. Patient received lasix earlier and diuresis approximately . NP at bedside. Lung sounds diminished. Will continue to monitor, advised bedside RN to call with further needs

## 2013-11-01 NOTE — Progress Notes (Signed)
  Pt very lethargic not following command well md notified CT done MD was aware of result and new orders started for stroke, will continue to monitor pt

## 2013-11-01 NOTE — Progress Notes (Signed)
Responded to patient yelling "help". When assessed patient stated he was having trouble breathing and he was found to be diaphoretic. Blood sugar 109. BP 187/109. Sats originally 80, up O2 from 2L to 4 and sats came back up. Rapid response called and responded. Gave tylenol supp for rectal temp of 100.5. Dr. Claiborne Billings paged and responded to the bedside. No new orders given but advised to call if needed or RN feeling uncomfortable. Patient appeared to "look better after interventions. Lasix IV given around 2130 and patient responding accordingly. Will report off to RN Maureen Ralphs as my shift is over.

## 2013-11-01 NOTE — Progress Notes (Signed)
*  PRELIMINARY RESULTS* Vascular Ultrasound Carotid Duplex (Doppler) has been completed.  Preliminary findings: Bilateral:  1-39% ICA stenosis.  Vertebral artery flow is antegrade.      Farrel Demark, RDMS, RVT  11/01/2013, 3:05 PM

## 2013-11-01 NOTE — Progress Notes (Signed)
[facility]  Patient Status: Active (receiving services up to time of hospitalization)  [facility] is providing the following services: PT  If patient discharges after hours, please call [phone].   [name] 11/01/2013, 4:46 PM

## 2013-11-01 NOTE — Progress Notes (Addendum)
NEURO HOSPITALIST PROGRESS NOTE   SUBJECTIVE:                                                                                                                        Patient is very drowsy, no verbal output.  Can hear secretions in trachea when breathing. He follows simple commends but exam is limited due to drowsiness. He had just received Vicodin per son. Today he seems less verbal.  HE is going for repeat CT head now.   OBJECTIVE:                                                                                                                           Vital signs in last 24 hours: Temp:  [98.1 F (36.7 C)-98.6 F (37 C)] 98.4 F (36.9 C) (12/12 1030) Pulse Rate:  [68-74] 68 (12/12 1030) Resp:  [18] 18 (12/12 1030) BP: (144-176)/(68-85) 144/80 mmHg (12/12 1030) SpO2:  [93 %-98 %] 93 % (12/12 1030) Weight:  [109.77 kg (242 lb)] 109.77 kg (242 lb) (12/12 0500)  Intake/Output from previous day: 12/11 0701 - 12/12 0700 In: 590 [P.O.:590] Out: 777 [Urine:777] Intake/Output this shift:   Nutritional status: Carb Control  Past Medical History  Diagnosis Date  . Type 2 diabetes mellitus   . Hyperlipidemia   . Essential hypertension, benign   . Coronary artery disease     Possible - poorly documented  . Chronic pain   . Diverticulitis   . COPD (chronic obstructive pulmonary disease)   . Anxiety   . Depression   . Cerebrovascular disease   . Nonischemic cardiomyopathy     LVEF 30-35% 5/11  . MRSA (methicillin resistant Staphylococcus aureus) septicemia   . Diabetes mellitus   . Arthritis   . Stroke     residual left hemiplegia, left visual field deficit  . CHF (congestive heart failure)       Neurologic Exam:   Mental Status: Patient is very drowsy, able to follow simple commands such as lifting his arms, squeezing my hand, smiling but not complex commands.  Cranial Nerves: II: Visual fields shows a left hemianopsia, pupils equal,  round, reactive to light and accommodation III,IV, VI: ptosis not present, extra-ocular motions intact V,VII: smile  symmetric, facial light touch sensation normal bilaterally VIII: hearing normal bilaterally IX,X: gag reflex present XI: bilateral shoulder shrug XII: midline tongue extension without atrophy or fasciculations  Motor: Right : Upper extremity   5/5    Left:     Upper extremity   3/5  Lower extremity   5/5     Lower extremity   4/5 --when right arm held out in front of him it will drift laterally Tone and bulk:normal tone throughout; no atrophy noted Sensory: decreased on the left arm and leg along with showing left arm and leg neglect.  Deep Tendon Reflexes:  Right: Upper Extremity   Left: Upper extremity   biceps (C-5 to C-6) 2/4   biceps (C-5 to C-6) 2/4 tricep (C7) 2/4    triceps (C7) 2/4 Brachioradialis (C6) 2/4  Brachioradialis (C6) 2/4  Lower Extremity Lower Extremity  quadriceps (L-2 to L-4) 2/4   quadriceps (L-2 to L-4) 2/4 Achilles (S1) 1/4   Achilles (S1) 1/4  Plantars: Right: downgoing   Left: downgoing  Lab Results: Lab Results  Component Value Date/Time   CHOL 167 03/27/2012  6:03 AM   Lipid Panel No results found for this basename: CHOL, TRIG, HDL, CHOLHDL, VLDL, LDLCALC,  in the last 72 hours  Studies/Results: Ct Head Wo Contrast  11/19/2013   CLINICAL DATA:  Weakness.  Altered mental status.  Fatigue.  EXAM: CT HEAD WITHOUT CONTRAST  TECHNIQUE: Contiguous axial images were obtained from the base of the skull through the vertex without intravenous contrast.  COMPARISON:  09/27/2013  FINDINGS: Prior right side at occipital, temporal, and parietal lobe infarcts with chronic dystrophic calcification along the superior infarct margin.  Periventricular white matter and corona radiata hypodensities favor chronic ischemic microvascular white matter disease.  No acute intracranial findings. No intracranial hemorrhage, mass lesion, or acute CVA.  IMPRESSION: 1.  Old right-sided infarcts in the occipital, parietal, and temporal lobes. No acute intracranial findings.   Electronically Signed   By: Herbie Baltimore M.D.   On: 11-19-2013 22:09   Dg Chest Port 1 View  10/31/2013   CLINICAL DATA:  Weakness; altered mental status.  EXAM: PORTABLE CHEST - 1 VIEW  COMPARISON:  Chest radiograph performed 11/11/2012  FINDINGS: The lungs are well-aerated. Mild right basilar atelectasis is noted. There is no evidence of pleural effusion or pneumothorax. There is mild elevation of the right hemidiaphragm. A nodular density at the left lung base appears stable from 2013 and is likely benign; it may reflect a small calcified granuloma or possibly the nipple shadow.  The cardiomediastinal silhouette is normal in size. An AICD is noted overlying the right chest wall, with a single lead ending overlying the right ventricle. No acute osseous abnormalities are seen. Healed bilateral rib fractures are seen.  IMPRESSION: Mild right basilar atelectasis noted, with mild elevation the right hemidiaphragm. Lungs otherwise clear.   Electronically Signed   By: Roanna Raider M.D.   On: 10/31/2013 00:37    MEDICATIONS  Scheduled: . aspirin EC  81 mg Oral Daily  . carvedilol  25 mg Oral BID WC  . citalopram  40 mg Oral Daily  . diazepam  5 mg Oral TID  . enoxaparin (LOVENOX) injection  40 mg Subcutaneous Q24H  . furosemide  20 mg Oral QPM  . influenza vac split quadrivalent PF  0.5 mL Intramuscular Tomorrow-1000  . insulin aspart  0-9 Units Subcutaneous TID WC  . insulin glargine  72 Units Subcutaneous QHS  . levETIRAcetam  500 mg Oral BID  . lisinopril  20 mg Oral Daily  . mirtazapine  30 mg Oral QHS  . OxyCODONE  15 mg Oral Q12H  . pneumococcal 23 valent vaccine  0.5 mL Intramuscular Tomorrow-1000  . sodium chloride  3 mL Intravenous Q12H    ASSESSMENT/PLAN:                                                                                                             57 yo M with episode of left arm and leg tightening, incontenance most consistent with seizure. Currently he is very drowsy but had just received Vicodin. He remains on Keppra 500 mg BID. Repeat head CT today.  No further seizures.   Due to his drowsiness will change patient from Keppra to Dilantin 100 mg Q8 hours.  We continue to follow after CT head.    Assessment and plan discussed with with attending physician and they are in agreement.    Felicie Morn PA-C [facility] [phone]  11/01/2013, 11:50 AM

## 2013-11-01 NOTE — Progress Notes (Signed)
SLP Cancellation Note  Patient Details Name: Peter Long MRN: [id number] DOB: [birth year]   Cancelled treatment:        Attempted BSE/SLE this PM, pt continues to present with decreased alertness. Son present stating no improvement from previous attempt. Will f/u tomorrow to determine if pt appropriate for evaluation in AM.   Chyrel Masson 11/01/2013, 4:29 PM

## 2013-11-01 NOTE — Progress Notes (Signed)
Echo Lab  2D Echocardiogram completed.  Jalexus Brett L Emil Klassen, RDCS 11/01/2013 2:47 PM

## 2013-11-01 NOTE — Progress Notes (Signed)
SLP Cancellation Note  Patient Details Name: Peter Long MRN: [id number] DOB: [birth year]   Cancelled treatment:        Pt unable to participate in evaluation at this time, due to sedating meds received for pain.  Son present, and reports pt had no comm/cog deficits prior to admit.  Pt speech is currently quite dysarthric, but son indicated he felt like this was due to his somnolence.  Results of comm/cog assessment would not be valid at this time due to decreased alertness.  Will continue efforts. Lochlann Mastrangelo B. Prosser, [facility], CCC-SLP [phone]   Leigh Aurora 11/01/2013, 11:17 AM

## 2013-11-02 ENCOUNTER — Inpatient Hospital Stay (HOSPITAL_COMMUNITY): Payer: Medicare Other

## 2013-11-02 DIAGNOSIS — F411 Generalized anxiety disorder: Secondary | ICD-10-CM

## 2013-11-02 LAB — CBC WITH DIFFERENTIAL/PLATELET
Basophils Absolute: 0 10*3/uL (ref 0.0–0.1)
Eosinophils Absolute: 0.2 10*3/uL (ref 0.0–0.7)
Eosinophils Relative: 1 % (ref 0–5)
Lymphocytes Relative: 10 % — ABNORMAL LOW (ref 12–46)
Lymphs Abs: 1.8 10*3/uL (ref 0.7–4.0)
MCH: 30.6 pg (ref 26.0–34.0)
MCV: 85.3 fL (ref 78.0–100.0)
Monocytes Relative: 8 % (ref 3–12)
Neutrophils Relative %: 81 % — ABNORMAL HIGH (ref 43–77)
Platelets: 184 10*3/uL (ref 150–400)
RBC: 6.05 MIL/uL — ABNORMAL HIGH (ref 4.22–5.81)
RDW: 13 % (ref 11.5–15.5)
WBC: 17.9 10*3/uL — ABNORMAL HIGH (ref 4.0–10.5)

## 2013-11-02 LAB — ALBUMIN: Albumin: 4 g/dL (ref 3.5–5.2)

## 2013-11-02 LAB — GLUCOSE, CAPILLARY
Glucose-Capillary: 110 mg/dL — ABNORMAL HIGH (ref 70–99)
Glucose-Capillary: 90 mg/dL (ref 70–99)
Glucose-Capillary: 106 mg/dL — ABNORMAL HIGH (ref 70–99)
Glucose-Capillary: 120 mg/dL — ABNORMAL HIGH (ref 70–99)
Glucose-Capillary: 167 mg/dL — ABNORMAL HIGH (ref 70–99)

## 2013-11-02 LAB — LIPID PANEL
Cholesterol: 170 mg/dL (ref 0–200)
HDL: 31 mg/dL — ABNORMAL LOW (ref >39)
LDL Cholesterol: 97 mg/dL (ref 0–99)
Total CHOL/HDL Ratio: 5.5 RATIO
Triglycerides: 210 mg/dL — ABNORMAL HIGH (ref <150)

## 2013-11-02 LAB — PHENYTOIN LEVEL, TOTAL: Phenytoin Lvl: <2.5 ug/mL — ABNORMAL LOW (ref 10.0–20.0)

## 2013-11-02 MED ORDER — PIPERACILLIN-TAZOBACTAM 3.375 G IVPB
3.3750 g | Freq: Three times a day (TID) | INTRAVENOUS | Status: DC
  Administered 2013-11-02 – 2013-11-03 (×4): 3.375 g via INTRAVENOUS
  Filled 2013-11-02 (×9): qty 50

## 2013-11-02 MED ORDER — INSULIN GLARGINE 100 UNIT/ML ~~LOC~~ SOLN
30.0000 [IU] | Freq: Every day | SUBCUTANEOUS | Status: DC
  Filled 2013-11-02 (×3): qty 0.3

## 2013-11-02 MED ORDER — HYDROMORPHONE HCL PF 1 MG/ML IJ SOLN
0.5000 mg | INTRAMUSCULAR | Status: DC | PRN
  Administered 2013-11-02 – 2013-11-03 (×3): 0.5 mg via INTRAVENOUS
  Filled 2013-11-02 (×3): qty 1

## 2013-11-02 MED ORDER — INSULIN ASPART 100 UNIT/ML ~~LOC~~ SOLN
0.0000 [IU] | SUBCUTANEOUS | Status: DC
  Administered 2013-11-02: 1 [IU] via SUBCUTANEOUS
  Administered 2013-11-02 – 2013-11-03 (×2): 2 [IU] via SUBCUTANEOUS
  Administered 2013-11-03: 3 [IU] via SUBCUTANEOUS
  Administered 2013-11-03: 5 [IU] via SUBCUTANEOUS

## 2013-11-02 MED ORDER — FUROSEMIDE 10 MG/ML IJ SOLN
40.0000 mg | Freq: Once | INTRAMUSCULAR | Status: AC
  Administered 2013-11-02: 40 mg via INTRAVENOUS
  Filled 2013-11-02: qty 4

## 2013-11-02 MED ORDER — FUROSEMIDE 10 MG/ML IJ SOLN
40.0000 mg | Freq: Two times a day (BID) | INTRAMUSCULAR | Status: DC
  Administered 2013-11-02 – 2013-11-03 (×2): 40 mg via INTRAVENOUS
  Filled 2013-11-02 (×3): qty 4

## 2013-11-02 MED ORDER — HYDRALAZINE HCL 20 MG/ML IJ SOLN
10.0000 mg | Freq: Four times a day (QID) | INTRAMUSCULAR | Status: DC | PRN
  Administered 2013-11-02: 10 mg via INTRAVENOUS
  Filled 2013-11-02: qty 1

## 2013-11-02 NOTE — Consult Note (Addendum)
Patient Peter Long      DOB: 06/20/1956      WUJ:[id number]     Consult Note from the Palliative Medicine Team at [facility]    Consult Requested by: Dr. Benjamine Mola     PCP: Kirk Ruths, MD Reason for Consultation:GoC     Phone Number:[phone] Related symptom recommendations Assessment of patients Current state: Patient is a 56 yr old white male with a known past medical history for multiple stroke which he had been able to adapt to the residual effects .  His spouse relates he had been complaining of a headache several days prior to being brought to the hospital .  He supsequently developed altered mental status and was brought to the ER.  Initial CT did not show new stroke , subsequent  CT suggests evolving stroke.  Patient now with respiratory failure . Chest xray not congruent showing improved respiratory failure.  Family struggling.  I met with the patient's spouse who told her daughter and son that she believes that Peter Long is not going to survive this stroke.  His daughter became distraught and left the meeting. Family decided to transfer to [facility] on bipap and give him 24 more hours to see if he would stabilize.  They have elected do not resuscitate status.   Goals of Care: 1.  Code Status: DnR but trial bipap   2. Scope of Treatment: Continue supportive measures while we see which direction Peter Long is going.  He will need to transition to stepdown because of bipap.  Will need to consider deactivating AICD.  4. Disposition: to be determined, could be hospital death.   3. Symptom Management:   1. Anxiety/Agitation: ativan prn 2. Pain:dilaudid prn ok with family 3. Bowel Regimen: monitor 4. Delirium: secondary to stroke 5. Fever: prn tylenol, and stroke 6. Nausea/Vomiting: prn zofran 7. Terminal Secretions: add atropine prn   4. Psychosocial:  Patient is married with two sons and a daughter.  He is described and a very gentle man  5. Spiritual: family declines  chaplains involvement at this time        Patient Documents Completed or Given: Document Given Completed  Advanced Directives Pkt    MOST    DNR    Gone from My Sight    Hard Choices      Brief HPI: Patient was admitted with altered mental status.  Initial CT head only showed old extensive strokes, but second Ct showed new area of focus consitent with stroke.  Patient now with respiratory distress and failure.  We have been asked to assist with goals of care.   ROS: can not obtain due to altered mental status.    PMH:  Past Medical History  Diagnosis Date  . Type 2 diabetes mellitus   . Hyperlipidemia   . Essential hypertension, benign   . Coronary artery disease     Possible - poorly documented  . Chronic pain   . Diverticulitis   . COPD (chronic obstructive pulmonary disease)   . Anxiety   . Depression   . Cerebrovascular disease   . Nonischemic cardiomyopathy     LVEF 30-35% 5/11  . MRSA (methicillin resistant Staphylococcus aureus) septicemia   . Diabetes mellitus   . Arthritis   . Stroke     residual left hemiplegia, left visual field deficit  . CHF (congestive heart failure)      PSH: Past Surgical History  Procedure Laterality Date  . Right gluteal abscess drainage  2005  .  Abdominal surgery  2004  . Ercp with stone extraction  2010  . Arthroscopic left shoulder surgery  2011  . Cardiac defibrillator placement  2005    [facility] - Dr. Severiano Gilbert, [facility] [facility] [employer] [facility]   I have reviewed the [facility] and [facility] and  If appropriate update it with new information. Allergies  Allergen Reactions  . Morphine Other (See Comments)    Aggitation, Meanness.    Scheduled Meds: . carvedilol  25 mg Oral BID WC  . citalopram  40 mg Oral Daily  . clopidogrel  75 mg Oral Q breakfast  . enoxaparin (LOVENOX) injection  40 mg Subcutaneous Q24H  . furosemide  40 mg Intravenous Q12H  . furosemide  20 mg Oral QPM  . insulin aspart  0-9 Units Subcutaneous TID WC  . insulin  glargine  72 Units Subcutaneous QHS  . lisinopril  20 mg Oral Daily  . mirtazapine  30 mg Oral QHS  . phenytoin (DILANTIN) IV  100 mg Intravenous Q8H  . piperacillin-tazobactam (ZOSYN)  IV  3.375 g Intravenous Q8H  . pneumococcal 23 valent vaccine  0.5 mL Intramuscular Tomorrow-1000  . sodium chloride  3 mL Intravenous Q12H   Continuous Infusions: . sodium chloride 20 mL/hr (10/31/13 0433)   PRN Meds:.acetaminophen, acetaminophen, albuterol, hydrALAZINE, HYDROmorphone (DILAUDID) injection, LORazepam, ondansetron (ZOFRAN) IV, ondansetron, oxyCODONE, traZODone    BP 161/93  Pulse 80  Temp(Src) 98.4 F (36.9 C) (Axillary)  Resp 40  Ht 6\' 1"  (1.854 m)  Wt 104.554 kg (230 lb 8 oz)  BMI 30.42 kg/m2  SpO2 94%   PPS: 10%   Intake/Output Summary (Last 24 hours) at 11/02/13 1336 Last data filed at 11/02/13 0825  Gross per 24 hour  Intake      0 ml  Output   2000 ml  Net  -2000 ml    Physical Exam:  General: altered mental status on bipap, mild distress, will open eyes but not following commands HEENT:  Pupils round reactive, mm dry Chest:   Decreased with bilateral course wet rhonchi CVS: tachy , S1, S2 no MRG Abdomen:soft, not tender on distended Ext: warm, not moving either side Neuro: altered mental status, not moving right or left side.  Labs: CBC    Component Value Date/Time   WBC 17.9* 11/02/2013 0055   RBC 6.05* 11/02/2013 0055   HGB 18.5* 11/02/2013 0055   HCT 51.6 11/02/2013 0055   PLT 184 11/02/2013 0055   MCV 85.3 11/02/2013 0055   MCH 30.6 11/02/2013 0055   MCHC 35.9 11/02/2013 0055   RDW 13.0 11/02/2013 0055   LYMPHSABS 1.8 11/02/2013 0055   MONOABS 1.4* 11/02/2013 0055   EOSABS 0.2 11/02/2013 0055   BASOSABS 0.0 11/02/2013 0055     CMP     Component Value Date/Time   NA 137 11/01/2013 0626   K 3.7 11/01/2013 0626   CL 101 11/01/2013 0626   CO2 25 11/01/2013 0626   GLUCOSE 109* 11/01/2013 0626   BUN 8 11/01/2013 0626   CREATININE 0.92  11/01/2013 0626   CALCIUM 9.2 11/01/2013 0626   PROT 6.8 11/01/2013 0626   ALBUMIN 4.0 11/02/2013 0055   AST 15 11/01/2013 0626   ALT 11 11/01/2013 0626   ALKPHOS 136* 11/01/2013 0626   BILITOT 0.6 11/01/2013 0626   GFRNONAA >90 11/01/2013 0626   GFRAA >90 11/01/2013 0626    Chest Xray Reviewed/Impressions:  Mild right basilar atelectasis noted, with mild elevation the right  hemidiaphragm. Lungs otherwise clear.  CT scan of the Head Reviewed/Impressions:New focus of low attenuation in the medial left temporal lobe,  concerning for acute/ subacute infarct.       Time In Time Out Total Time Spent with Patient Total Overall Time  1245 pm 140 pm 20 min 55 min    Greater than 50%  of this time was spent counseling and coordinating care related to the above assessment and plan.  Kymari Nuon L. Ladona Ridgel, MD MBA The Palliative Medicine Team at [facility] Phone: [phone] Pager: [phone]

## 2013-11-02 NOTE — Consult Note (Signed)
Patient ZO:XWRUEAV RODY KEADLE      DOB: 22-Mar-1956      WUJ:[id number]  Summary of Goals of Care at this time; full note to follow:  Met with wife Peter Long, son Peter Long, and daughter Peter Long  Reviewed findings of new stroke and respiratory failure related to edema and possible aspiration.  Spouse recognizes severity of issues and has told her children that this may not turn out as well as the last times he has had strokes.  Daughter Peter Long very angry that her dad could possibly die, and is handling the news of his decline with very aggressive speech toward family. She requested to leave the goals of care meeting.  Family for now wishes to do what we can to reverse his breathing problem including the bipap but not including CPR or Vent support.  We agreed to remeet later today to see how he is doing and making further decisions from their.  Daughter worried that we are just pulling the plug. Offered chaplain support which was declined at this time.   Recommend:  1.DNR honor   2.  Respiratory failure:  Transfer to stepdown and continue bipap as we see how he responds and give family time to process   3. NPO for now   4.  Agree with low dose dilaudid for pain or distress.  Will revisit later today to reassess needs and support.  Discussed with Dr. Benjamine Mola  Total time 1245 pm- 140 pm  Hazael Olveda L. Ladona Ridgel, MD MBA The Palliative Medicine Team at [facility] Phone: [phone] Pager: [phone]

## 2013-11-02 NOTE — Progress Notes (Signed)
Thank you for consulting the Palliative Medicine Team at [facility] to meet your patient's and family's needs.   The reason that you asked [place] to see your patient is  For GOC  We have scheduled your patient for a meeting:  Today 12/13 1230 pm  The Surrogate decision make is: spouse, [name] [phone]  Other family members that need to be present: at her discretion    Your patient is able/unable to participate: unable  Additional Narrative:  Ne w stroke   Peter Zendejas L. Ladona Ridgel, MD MBA The Palliative Medicine Team at [facility] Phone: [phone] Pager: [phone]

## 2013-11-02 NOTE — Progress Notes (Signed)
SLP Cancellation Note  Patient Details Name: Peter Long MRN: [id number] DOB: [birth year]   Cancelled treatment:        Per RN, pt inappropriate for BSE at this time, requested SLP to follow up tomorrow. MD scheduled to initiate conversation regarding palliative care with family and changed pt to NPO for remainder of the day. Pt out for procedure at this time. Will continue efforts tomorrow.   Chyrel Masson 11/02/2013, 2:01 PM

## 2013-11-02 NOTE — Progress Notes (Signed)
Patient OZ:HYQMVHQ E Boxer      DOB: November 30, 1955      ION:[id number]  Returned earlier this evening to check on patient who had been moved to step down.  No family at the bedside.  Patient remains on bipap.  Discussed case with nursing request low dose dilaudid to be used to treat respiratory distress.    Variable  0 Points 1 Point 2 Points Total  Heart rate per minute  <90 beats 90-109 beats >110 beats 1  Respiratory  Rate per minute < 18 breaths 19-30 breaths  >30 breaths 2  Restlessness; nonpurposeful movements None  occas slight movement Frequent movement 0  Paradoxical breathing pattern: None  Present 0  Accessory muscle use: rise in clavicle during inspiration None Slight rise Pronounced rise 1  Grunting at end-expiration: guttural sound None  Present 0  Nasal flaring: involuntary movement of nares None  Present 0  Look of fear None  Eyes wide 0  Overall total out of 16    4/16    Respiratory Distress Observation Scale Journal of Palliative Medicine Vol. 13, Number 3, 2010 Campbell et al. Page. 285-290  Will continue to work with family in am.  Patient has AICD in place family will need to decide on deactivation as it appears to still be in shocking mode.  They have asked [place] to try to work with his lungs to stabilize him.  Lasix being given now.  Will attempt to help family again in the am to solidify goals of care.   Total time 15 min Kaedon Fanelli L. Ladona Ridgel, MD MBA The Palliative Medicine Team at [facility] Phone: [phone] Pager: [phone]

## 2013-11-02 NOTE — Progress Notes (Signed)
Blood pressure taken at pm 249/120, pt. Is unresponsive at this time. Have paged PA for and informed him of new blood pressure of 169/107. He will increase the hydralazine  Dose if needed.

## 2013-11-02 NOTE — Progress Notes (Signed)
Called by primary RN due to patients respiratory status declining and requiring BIPAP.  Upon arrival to patients room, Family and Dr. Benjamine Mola at bedside.  Spoke with Dr. Benjamine Mola, Family meeting with Dr. Ladona Ridgel to occur now.  RR 40's, diaphoretic and using accessory muscles.  RT placed patient on bipap, tolerating well.  RR 30's with decreased WOB.  Primary RN also gave dilaudid as per order.  Patient comfortable and resting well.  Patient transferred to 3S15 at 1430, via bed with monitor and bipap machine.  Belongings with patient and family updated.  RN to call if assitance needed

## 2013-11-02 NOTE — Progress Notes (Signed)
PT Cancellation Note  Patient Details Name: Peter Long MRN: [id number] DOB: 08/14/1956   Cancelled Treatment:    Reason Eval/Treat Not Completed: Medical issues which prohibited therapy   Fabio Asa 11/02/2013, 9:55 AM

## 2013-11-02 NOTE — Progress Notes (Signed)
OT Cancellation Note  Patient Details Name: Peter Long MRN: [id number] DOB: Apr 14, 1956   Cancelled Treatment:    Reason Eval/Treat Not Completed: Medical issues which prohibited therapy.  11/02/2013 Cipriano Mile OTR/L Pager [phone] Office [phone]

## 2013-11-02 NOTE — Progress Notes (Signed)
RCS RN called me to asses patient. Patient had RR>35bpm. Cough was congested and wet. BS were crackles and diminished. NTS patient for small amount thin white secretions. Instructed patient and patient'd brother on use of flutter valve. Patient returned demo but was weak. Discussed with RN about moving patient and using bipap if that was in line with the patient's wishes. She said discussion about that would follow.

## 2013-11-02 NOTE — Progress Notes (Signed)
SLP Cancellation Note  Patient Details Name: BASSEL GASKILL MRN: [id number] DOB: 1956/01/03   Cancelled treatment:        Attempted to complete BSE and SLE this AM, pt is lethargic and presents with minimal verbal responses. RN requested SLP to return this PM. Will continue efforts and f/u this PM.    Chyrel Masson 11/02/2013, 8:17 AM

## 2013-11-02 NOTE — Progress Notes (Signed)
Pt transferred to 3S15 with RT, Rapid Response RN, NT and this RN on bipap. Report given to receiving RN via phone prior to transfer with all belongings. Family at bedside during transfer.

## 2013-11-02 NOTE — Progress Notes (Signed)
[facility] PROGRESS NOTE  Peter Long EAV:[id number] DOB: [birth year] DOA: 10/25/2013 PCP: Kirk Ruths, MD  Assessment/Plan: Respiratory distress- will give low dose dilaudid as not able to tolerate morphine per family; family requesting bipap for symptom relief -initial improvement with lasix last PM but not this AM  Possible seizures with new CVA- initially given Keppra but changed to dilantin by the neurologist. EEG did not show seizres. -repeat head CT : positive for CVA- unable to have MRI due to pacemaker - not following commands- having visual disturbances  Acute encephalopathy - possibly post ictal. Closely follow -most likely due to CVA.   Mild leukocytosis - trend, U/A and chest x ray not representative of infection but more fluid ?aspiration- will start zosyn  Diabetes mellitus type 2 - patient is on Lantus. Closely follow CBGs.    History of nonischemic cardiomyopathy status post AICD placement - closely follow intake output and metabolic panel.  EF decreased  History of CVA with left-sided hemiparesis - on aspirin.   Chronic pain - hold sedating medications   Code Status: full Family Communication: son/wife at bedside for palliative care  Disposition Plan:    Consultants:  neuro  Procedures:  EEg  Antibiotics:    HPI/Subjective: Coarser upper airway sounds More work of breathing Will open eyes to voice    Objective: Filed Vitals:   11/02/13 1001  BP: 161/93  Pulse: 80  Temp: 98.4 F (36.9 C)  Resp: 40    Intake/Output Summary (Last 24 hours) at 11/02/13 1216 Last data filed at 11/02/13 0825  Gross per 24 hour  Intake      0 ml  Output   2000 ml  Net  -2000 ml   Filed Weights   10/31/13 0537 11/01/13 0500 11/02/13 0500  Weight: 108.999 kg (240 lb 4.8 oz) 109.77 kg (242 lb) 104.554 kg (230 lb 8 oz)    Exam:  General: chronically ill appearing, increasing respiratory distress  Cardiovascular: S1-S2 heard.   Respiratory: coarse breath sounds  Abdomen: Soft nontender bowel sounds present.  Skin: No rash. Chronic skin bruises.  Musculoskeletal: No edema.  Psychiatric: Patient is confused.  Neurologic: oriented to person, place, time   Data Reviewed: Basic Metabolic Panel:  Recent Labs Lab 10/23/2013 2144 11/06/2013 2150 10/31/13 0551 11/01/13 0626  NA 138 142 139 137  K 3.8 3.9 3.7 3.7  CL 102 101 103 101  CO2 30  --  24 25  GLUCOSE 154* 150* 140* 109*  BUN 9 8 9 8   CREATININE 0.96 1.20 0.89 0.92  CALCIUM 9.0  --  8.9 9.2   Liver Function Tests:  Recent Labs Lab 11/03/2013 2144 10/31/13 0551 11/01/13 0626 11/02/13 0055  AST 18 15 15   --   ALT 13 12 11   --   ALKPHOS 148* 135* 136*  --   BILITOT 0.5 0.4 0.6  --   PROT 6.8 6.5 6.8  --   ALBUMIN 3.6 3.3* 3.6 4.0   No results found for this basename: LIPASE, AMYLASE,  in the last 168 hours  Recent Labs Lab 11/07/2013 2144 10/31/13 1330  AMMONIA RESULTS UNAVAILABLE DUE TO INTERFERING SUBSTANCE 36   CBC:  Recent Labs Lab 11/06/2013 2144 11/09/2013 2150 10/31/13 0551 11/01/13 0626 11/02/13 0055  WBC 12.2*  --  10.9* 13.4* 17.9*  NEUTROABS 8.6*  --  7.1  --  14.5*  HGB 17.1* 17.0 16.0 16.4 18.5*  HCT 47.8 50.0 46.0 46.1 51.6  MCV 85.8  --  85.7  84.0 85.3  PLT 186  --  176 180 184   Cardiac Enzymes:  Recent Labs Lab 11/10/2013 2144  TROPONINI <0.30   BNP (last 3 results)  Recent Labs  11/11/12 1408  PROBNP 210.4*   CBG:  Recent Labs Lab 11/01/13 2240 11/02/13 0101 11/02/13 0427 11/02/13 0729 11/02/13 1152  GLUCAP 109* 110* 90 106* 120*    No results found for this or any previous visit (from the past 240 hour(s)).   Studies: Ct Head Wo Contrast  11/01/2013   CLINICAL DATA:  Stroke.  Decreased level of consciousness.  EXAM: CT HEAD WITHOUT CONTRAST  TECHNIQUE: Contiguous axial images were obtained from the base of the skull through the vertex without intravenous contrast.  COMPARISON:  11/09/2013 and  earlier  FINDINGS: There is an approximately 2.0 x 1.1 cm focus of low attenuation within the medial left temporal lobe (series 2, image 12) which was not present on multiple prior examinations. Areas of encephalomalacia corresponding to remote infarcts are again seen in the right temporal, parietal, and occipital lobes. Small amount of mineralization within the right parietal infarct is unchanged. There is ex vacuo dilatation of the right lateral ventricle. Diffuse prominence of the ventricles and sulci is compatible with moderate cerebral atrophy, advanced for age. There is no evidence of acute intracranial hemorrhage, mass, midline shift, or extra-axial fluid collection. Periventricular white matter hypodensities are compatible with moderate chronic small vessel ischemic disease. The orbits are unremarkable. The visualized paranasal sinuses and mastoid air cells are clear.  IMPRESSION: New focus of low attenuation in the medial left temporal lobe, concerning for acute/ subacute infarct.  These results will be called to the ordering clinician or representative by the Radiologist Assistant, and communication documented in the PACS Dashboard.   Electronically Signed   By: Sebastian Ache   On: 11/01/2013 12:45   Dg Chest Port 1 View  11/01/2013   CLINICAL DATA:  Shortness of breath.  Coronary artery disease.  EXAM: PORTABLE CHEST - 1 VIEW  COMPARISON:  10/23/2013  FINDINGS: Mild cardiomegaly appears stable. Increased interstitial infiltrate seen bilaterally, suspicious for mild edema. No evidence of pulmonary airspace disease or consolidation. No evidence of pleural effusion.  AICD remains in stable position, with leads overlying the right ventricle and left subclavian vein.  IMPRESSION: Increased diffuse interstitial infiltrates suspicious for mild interstitial edema. Stable cardiomegaly.   Electronically Signed   By: Myles Rosenthal M.D.   On: 11/01/2013 19:04    Scheduled Meds: . carvedilol  25 mg Oral BID WC   . citalopram  40 mg Oral Daily  . clopidogrel  75 mg Oral Q breakfast  . enoxaparin (LOVENOX) injection  40 mg Subcutaneous Q24H  . furosemide  40 mg Intravenous Q12H  . furosemide  20 mg Oral QPM  . insulin aspart  0-9 Units Subcutaneous TID WC  . insulin glargine  72 Units Subcutaneous QHS  . lisinopril  20 mg Oral Daily  . mirtazapine  30 mg Oral QHS  . phenytoin (DILANTIN) IV  100 mg Intravenous Q8H  . piperacillin-tazobactam (ZOSYN)  IV  3.375 g Intravenous Q8H  . pneumococcal 23 valent vaccine  0.5 mL Intramuscular Tomorrow-1000  . sodium chloride  3 mL Intravenous Q12H   Continuous Infusions: . sodium chloride 20 mL/hr (10/31/13 0433)    Principal Problem:   Seizure Active Problems:   UNSPECIFIED SECONDARY CARDIOMYOPATHY   CVA WITH LEFT HEMIPARESIS   ICD-St.Jude   Type 2 diabetes mellitus   Seizures  Time spent: 35 min    Peter Long  [facility] Pager [phone]. If 7PM-7AM, please contact night-coverage at [email or web address], password [facility] 11/02/2013, 12:16 PM  LOS: 3 days

## 2013-11-02 NOTE — Progress Notes (Signed)
ANTIBIOTIC CONSULT NOTE - INITIAL  Pharmacy Consult for Zosyn Indication: aspiration  Allergies  Allergen Reactions  . Morphine Other (See Comments)    Aggitation, Meanness.     Patient Measurements: Height: 6\' 1"  (185.4 cm) Weight: 230 lb 8 oz (104.554 kg) IBW/kg (Calculated) : 79.9   Vital Signs: Temp: 97.9 F (36.6 C) (12/13 0416) Temp src: Oral (12/13 0416) BP: 183/94 mmHg (12/13 0700) Pulse Rate: 80 (12/13 0700) Intake/Output from previous day: 12/12 0701 - 12/13 0700 In: -  Out: 1000 [Urine:1000] Intake/Output from this shift:    Labs:  Recent Labs  10/24/2013 2150 10/31/13 0551 11/01/13 0626 11/02/13 0055  WBC  --  10.9* 13.4* 17.9*  HGB 17.0 16.0 16.4 18.5*  PLT  --  176 180 184  CREATININE 1.20 0.89 0.92  --    Estimated Creatinine Clearance: 111.2 ml/min (by C-G formula based on Cr of 0.92). No results found for this basename: VANCOTROUGH, VANCOPEAK, VANCORANDOM, GENTTROUGH, GENTPEAK, GENTRANDOM, TOBRATROUGH, TOBRAPEAK, TOBRARND, AMIKACINPEAK, AMIKACINTROU, AMIKACIN,  in the last 72 hours   Microbiology: No results found for this or any previous visit (from the past 720 hour(s)).  Medical History: Past Medical History  Diagnosis Date  . Type 2 diabetes mellitus   . Hyperlipidemia   . Essential hypertension, benign   . Coronary artery disease     Possible - poorly documented  . Chronic pain   . Diverticulitis   . COPD (chronic obstructive pulmonary disease)   . Anxiety   . Depression   . Cerebrovascular disease   . Nonischemic cardiomyopathy     LVEF 30-35% 5/11  . MRSA (methicillin resistant Staphylococcus aureus) septicemia   . Diabetes mellitus   . Arthritis   . Stroke     residual left hemiplegia, left visual field deficit  . CHF (congestive heart failure)     Assessment: 10 YOM admitted with likely seizure. Now more lethargic and with SOB- suspected aspiration PNA- to start Zosyn. SCr 0.92 with est CrCl >16mL/min. Blood cultures  sent this morning. WBC 17.9, Tmax/24h 100.9.  Goal of Therapy:  Eradication of infection  Plan:  1. Zosyn 3.375gm IV q8h extended interval dosing 2. Follow c/s, renal function, clinical progression, fever curve, LOT  Gaudencio Chesnut D. Khylah Kendra, PharmD, BCPS Clinical Pharmacist Pager: [phone] 11/02/2013 8:28 AM

## 2013-11-02 NOTE — Progress Notes (Signed)
Stroke Team Progress Note  HISTORY Peter Long is a 57 y.o. male with a history of strokes and DM who was last seen normal at 5:30pm on 11/03/2013.  At around, 7 pm, the wife noted that he "contorted" and tightend up throughout the left side. His son reported tonic left gaze deviation and head turning at onset. He lost urine and bowel continence with that episode. Since that time, he had been acting confused, but appeared to be improving some per the son.   LKW: 5:30pm  11/11/2013 tpa given?: no, outside of window   SUBJECTIVE Multiple family members at the bedside. The patient appears short of breath and working hard to breathe. He has been receiving Lasix for congestive heart failure and Zosyn for presumed aspiration pneumonia. Discussed with Dr. Benjamine Mola. The patient will be moved to a stepdown unit for more aggressive care. Palliative care to meet with family members today 11/02/2013 at 12:30 PM.  OBJECTIVE Most recent Vital Signs: Filed Vitals:   11/02/13 0416 11/02/13 0500 11/02/13 0700 11/02/13 1001  BP: 158/80  183/94 161/93  Pulse: 73  80 80  Temp: 97.9 F (36.6 C)   98.4 F (36.9 C)  TempSrc: Oral   Axillary  Resp: 20  24 40  Height:      Weight:  104.554 kg (230 lb 8 oz)    SpO2: 95%  93% 94%   CBG (last 3)   Recent Labs  11/02/13 0101 11/02/13 0427 11/02/13 0729  GLUCAP 110* 90 106*    IV Fluid Intake:   . sodium chloride 20 mL/hr (10/31/13 0433)    MEDICATIONS  . carvedilol  25 mg Oral BID WC  . citalopram  40 mg Oral Daily  . clopidogrel  75 mg Oral Q breakfast  . enoxaparin (LOVENOX) injection  40 mg Subcutaneous Q24H  . furosemide  20 mg Oral QPM  . insulin aspart  0-9 Units Subcutaneous TID WC  . insulin glargine  72 Units Subcutaneous QHS  . lisinopril  20 mg Oral Daily  . mirtazapine  30 mg Oral QHS  . phenytoin (DILANTIN) IV  100 mg Intravenous Q8H  . piperacillin-tazobactam (ZOSYN)  IV  3.375 g Intravenous Q8H  . pneumococcal 23 valent vaccine   0.5 mL Intramuscular Tomorrow-1000  . sodium chloride  3 mL Intravenous Q12H   PRN:  acetaminophen, acetaminophen, albuterol, hydrALAZINE, LORazepam, ondansetron (ZOFRAN) IV, ondansetron, oxyCODONE, traZODone  Diet:  NPO no liquids Activity: Up with assistance DVT Prophylaxis:  Lovenox  CLINICALLY SIGNIFICANT STUDIES Basic Metabolic Panel:   Recent Labs Lab 10/31/13 0551 11/01/13 0626  NA 139 137  K 3.7 3.7  CL 103 101  CO2 24 25  GLUCOSE 140* 109*  BUN 9 8  CREATININE 0.89 0.92  CALCIUM 8.9 9.2   Liver Function Tests:   Recent Labs Lab 10/31/13 0551 11/01/13 0626 11/02/13 0055  AST 15 15  --   ALT 12 11  --   ALKPHOS 135* 136*  --   BILITOT 0.4 0.6  --   PROT 6.5 6.8  --   ALBUMIN 3.3* 3.6 4.0   CBC:   Recent Labs Lab 10/31/13 0551 11/01/13 0626 11/02/13 0055  WBC 10.9* 13.4* 17.9*  NEUTROABS 7.1  --  14.5*  HGB 16.0 16.4 18.5*  HCT 46.0 46.1 51.6  MCV 85.7 84.0 85.3  PLT 176 180 184   Coagulation:   Recent Labs Lab 11/03/2013 2144  LABPROT 13.1  INR 1.01   Cardiac Enzymes:  Recent Labs Lab Nov 09, 2013 2144  TROPONINI <0.30   Urinalysis:   Recent Labs Lab 10/31/13 0208  COLORURINE YELLOW  LABSPEC 1.011  PHURINE 7.0  GLUCOSEU NEGATIVE  HGBUR NEGATIVE  BILIRUBINUR NEGATIVE  KETONESUR NEGATIVE  PROTEINUR NEGATIVE  UROBILINOGEN 1.0  NITRITE NEGATIVE  LEUKOCYTESUR NEGATIVE   Lipid Panel    Component Value Date/Time   CHOL 170 11/02/2013 0055   TRIG 210* 11/02/2013 0055   HDL 31* 11/02/2013 0055   CHOLHDL 5.5 11/02/2013 0055   VLDL 42* 11/02/2013 0055   LDLCALC 97 11/02/2013 0055   HgbA1C  Lab Results  Component Value Date   HGBA1C 7.4* 11/01/2013    Urine Drug Screen:     Component Value Date/Time   LABOPIA NONE DETECTED 10/31/2013 0208   COCAINSCRNUR NONE DETECTED 10/31/2013 0208   LABBENZ POSITIVE* 10/31/2013 0208   AMPHETMU NONE DETECTED 10/31/2013 0208   THCU NONE DETECTED 10/31/2013 0208   LABBARB NONE DETECTED  10/31/2013 0208    Alcohol Level:   Recent Labs Lab November 09, 2013 2144  ETH <11    Ct Head Wo Contrast 11/01/2013    New focus of low attenuation in the medial left temporal lobe, concerning for acute/ subacute infarct.     Dg Chest Port 1 View 11/01/2013    Increased diffuse interstitial infiltrates suspicious for mild interstitial edema. Stable cardiomegaly.      MRI MRA of the brain  - cannot be performed secondary to implantable defibrillator  2D Echocardiogram  ejection fraction 25-30%. No cardiac source of emboli identified.  Carotid Doppler  Carotid Duplex (Doppler) has been completed. Preliminary findings: Bilateral: 1-39% ICA stenosis. Vertebral artery flow is antegrade   EKG  pending  Therapy Recommendations pending  Physical Exam    Mental Status:  Patient is very drowsy - follows only occasional commands. Unable to answer questions adequately secondary to shortness of breath. Very limited exam at this time secondary to the patient's inability to cooperate. Motor:  Patient unable to move her extremities to any significant degree. He did grip my fingers with his left hand although this was very lightly.  Tone and bulk:normal tone throughout; no atrophy noted  Sensory: Unable to evaluate Deep Tendon Reflexes:  Right: Upper Extremity Left: Upper extremity  biceps (C-5 to C-6) 2/4 biceps (C-5 to C-6) 2/4  tricep (C7) 2/4 triceps (C7) 2/4  Brachioradialis (C6) 2/4 Brachioradialis (C6) 2/4  Lower Extremity Lower Extremity  quadriceps (L-2 to L-4) 2/4 quadriceps (L-2 to L-4) 2/4  Achilles (S1) 1/4 Achilles (S1) 1/4  Plantars:  Right: downgoing Left: downgoing    ASSESSMENT Peter Long is a 57 y.o. male presenting with left gaze deviation, "contortions" of the left side, and mild right upper extremity drift. TPA not given secondary to late presentation. CT scan 11/01/2013 shows a new focus of low attenuation in the medial left temporal lobe, concerning for  acute/ subacute infarct. Infarct felt to be embolic of unknown etiology. On aspirin 81 mg orally every day prior to admission. Now on clopidogrel 75 mg orally every day for secondary stroke prevention. Patient with resultant right hemiparesis with residual left hemiparesis from previous CVA.  Work up underway.   Suspected seizure activity - now on Dilantin  Leukocytosis - question aspiration - on Zosyn - currently n.p.o. - patient is not able to cooperate with speech therapy swallowing evaluation.  Hypertension   Left ventricular dysfunction - ejection fraction 25-30% - CHF history  Diabetes mellitus - Global A1c 7.4  Previous  stroke with left hemiparesis  Hyperlipidemia history  Increased lethargy noted 11/01/2013 - and Valium discontinued and pain medications decreased.  Hospital day # 3  TREATMENT/PLAN  The patient may be transferred to the stepdown unit secondary to respiratory distress.  Continue clopidogrel 75 mg orally every day for secondary stroke prevention.  Consider CT angiogram situation improves. ( No MRI/MRA secondary to implantable defibrillator )  Therapy evaluations on hold  Consider tube feedings if patient unable to eat and swallow safely  Monitor lethargy  Palliative care to meet with the family today, 11/02/2013, at 12:30 PM.  Delton See PA-C [facility] Pager [phone] 11/02/2013, 11:11 AM   I evaluated and examined patient, reviewed records, labs and imaging, and agree with note and plan. Significant respiratory distress. Family has met with palliative care. Prognosis is guarded.  Suanne Marker, MD 11/02/2013, 3:33 PM Certified in Neurology, Neurophysiology and Neuroimaging [facility] - Stroke Team  Please refer to [email or web address] for on-call Stroke MD

## 2013-11-02 NOTE — Progress Notes (Signed)
Patient received to room 3s bed15 on BIPAP with reap. Tech at bedside, as well as rapid response nurse and nurse from unit. Family members at bedside,emotional support given to family, oriented them to the unit as well as equipement in the room.Vitals obtained and patient resting at present.

## 2013-11-02 NOTE — Progress Notes (Signed)
MD notified of NIH change and declining respiratory status. Lasix ordered. Dr. Benjamine Mola arrived to assess patient in person. Will continue to monitor.

## 2013-11-03 DIAGNOSIS — Z515 Encounter for palliative care: Secondary | ICD-10-CM

## 2013-11-03 DIAGNOSIS — J9600 Acute respiratory failure, unspecified whether with hypoxia or hypercapnia: Secondary | ICD-10-CM

## 2013-11-03 LAB — BASIC METABOLIC PANEL
BUN: 24 mg/dL — ABNORMAL HIGH (ref 6–23)
Chloride: 100 mEq/L (ref 96–112)
GFR calc Af Amer: 72 mL/min — ABNORMAL LOW (ref >90)
GFR calc non Af Amer: 62 mL/min — ABNORMAL LOW (ref >90)
Glucose, Bld: 167 mg/dL — ABNORMAL HIGH (ref 70–99)
Potassium: 3.1 mEq/L — ABNORMAL LOW (ref 3.5–5.1)

## 2013-11-03 LAB — PHENYTOIN LEVEL, TOTAL: Phenytoin Lvl: 3.2 ug/mL — ABNORMAL LOW (ref 10.0–20.0)

## 2013-11-03 LAB — CBC
HCT: 55.6 % — ABNORMAL HIGH (ref 39.0–52.0)
Hemoglobin: 20.2 g/dL — ABNORMAL HIGH (ref 13.0–17.0)
MCHC: 36.3 g/dL — ABNORMAL HIGH (ref 30.0–36.0)
MCV: 85.5 fL (ref 78.0–100.0)
WBC: 29.2 10*3/uL — ABNORMAL HIGH (ref 4.0–10.5)

## 2013-11-03 LAB — GLUCOSE, CAPILLARY: Glucose-Capillary: 213 mg/dL — ABNORMAL HIGH (ref 70–99)

## 2013-11-03 MED ORDER — VANCOMYCIN HCL IN DEXTROSE 1-5 GM/200ML-% IV SOLN
1000.0000 mg | Freq: Two times a day (BID) | INTRAVENOUS | Status: DC
  Filled 2013-11-03: qty 200

## 2013-11-03 MED ORDER — LORAZEPAM 2 MG/ML IJ SOLN
1.0000 mg | INTRAMUSCULAR | Status: DC | PRN
  Administered 2013-11-03: 1 mg via INTRAVENOUS
  Filled 2013-11-03: qty 1

## 2013-11-03 MED ORDER — ATROPINE SULFATE 1 % OP SOLN
4.0000 [drp] | OPHTHALMIC | Status: DC | PRN
  Administered 2013-11-04: 4 [drp] via SUBLINGUAL
  Filled 2013-11-03: qty 2

## 2013-11-03 MED ORDER — HYDROMORPHONE BOLUS VIA INFUSION
1.0000 mg | INTRAVENOUS | Status: DC | PRN
  Filled 2013-11-03: qty 1

## 2013-11-03 MED ORDER — VANCOMYCIN HCL 10 G IV SOLR
2500.0000 mg | Freq: Once | INTRAVENOUS | Status: AC
  Administered 2013-11-03: 2500 mg via INTRAVENOUS
  Filled 2013-11-03: qty 2500

## 2013-11-03 MED ORDER — SCOPOLAMINE 1 MG/3DAYS TD PT72
1.0000 | MEDICATED_PATCH | TRANSDERMAL | Status: DC
  Administered 2013-11-03: 1.5 mg via TRANSDERMAL
  Filled 2013-11-03: qty 1

## 2013-11-03 MED ORDER — LORAZEPAM 2 MG/ML IJ SOLN
1.0000 mg/h | INTRAVENOUS | Status: DC
  Administered 2013-11-03: 1 mg/h via INTRAVENOUS
  Filled 2013-11-03 (×2): qty 25

## 2013-11-03 MED ORDER — HYDROMORPHONE HCL PF 1 MG/ML IJ SOLN
3.0000 mg | INTRAMUSCULAR | Status: DC | PRN
  Administered 2013-11-03: 3 mg via INTRAVENOUS
  Filled 2013-11-03: qty 3

## 2013-11-03 MED ORDER — SODIUM CHLORIDE 0.9 % IV SOLN
3.0000 mg/h | INTRAVENOUS | Status: DC
  Administered 2013-11-03 (×4): 2 mg/h via INTRAVENOUS
  Administered 2013-11-04: 3 mg/h via INTRAVENOUS
  Filled 2013-11-03 (×2): qty 5

## 2013-11-03 MED ORDER — HYDROMORPHONE HCL PF 1 MG/ML IJ SOLN: 1.0000 mg | INTRAMUSCULAR | Status: DC | PRN

## 2013-11-03 NOTE — Progress Notes (Signed)
Stroke Team Progress Note  HISTORY Peter Long is a 57 y.o. male with a history of strokes and DM who was last seen normal at 5:30pm on 11/11/2013.  At around, 7 pm, the wife noted that he "contorted" and tightend up throughout the left side. His son reported tonic left gaze deviation and head turning at onset. He lost urine and bowel continence with that episode. Since that time, he had been acting confused, but appeared to be improving some per the son.   LKW: 5:30pm  11/19/2013 tpa given?: no, outside of window   SUBJECTIVE Patient on 3S now, due to respiratory distress, now on BIPAP. Palliative care team has been meeting with family members. Wife at bedside.   OBJECTIVE Most recent Vital Signs: Filed Vitals:   11/03/13 0735 11/03/13 0834 11/03/13 1210 11/03/13 1215  BP: 148/84 141/88 102/65 102/65  Pulse: 116 119  96  Temp: 98.7 F (37.1 C)  99.7 F (37.6 C)   TempSrc: Axillary  Axillary   Resp: 34 36  22  Height:      Weight:      SpO2: 93% 96%  93%   CBG (last 3)   Recent Labs  11/03/13 0320 11/03/13 0728 11/03/13 1228  GLUCAP 155* 213* 252*    IV Fluid Intake:      MEDICATIONS  . enoxaparin (LOVENOX) injection  40 mg Subcutaneous Q24H  . insulin aspart  0-9 Units Subcutaneous Q4H  . insulin glargine  30 Units Subcutaneous QHS  . phenytoin (DILANTIN) IV  100 mg Intravenous Q8H  . piperacillin-tazobactam (ZOSYN)  IV  3.375 g Intravenous Q8H  . sodium chloride  3 mL Intravenous Q12H  . vancomycin  2,500 mg Intravenous Once  . [START ON 11-24-2013] vancomycin  1,000 mg Intravenous Q12H   PRN:  acetaminophen, acetaminophen, albuterol, hydrALAZINE, HYDROmorphone (DILAUDID) injection, LORazepam, ondansetron (ZOFRAN) IV, ondansetron  Diet:  NPO no liquids Activity: Up with assistance DVT Prophylaxis:  Lovenox  CLINICALLY SIGNIFICANT STUDIES Basic Metabolic Panel:   Recent Labs Lab 11/01/13 0626 11/03/13 0415  NA 137 145  K 3.7 3.1*  CL 101 100  CO2  25 23  GLUCOSE 109* 167*  BUN 8 24*  CREATININE 0.92 1.24  CALCIUM 9.2 10.0   Liver Function Tests:   Recent Labs Lab 10/31/13 0551 11/01/13 0626 11/02/13 0055  AST 15 15  --   ALT 12 11  --   ALKPHOS 135* 136*  --   BILITOT 0.4 0.6  --   PROT 6.5 6.8  --   ALBUMIN 3.3* 3.6 4.0   CBC:   Recent Labs Lab 10/31/13 0551  11/02/13 0055 11/03/13 0415  WBC 10.9*  < > 17.9* 29.2*  NEUTROABS 7.1  --  14.5*  --   HGB 16.0  < > 18.5* 20.2*  HCT 46.0  < > 51.6 55.6*  MCV 85.7  < > 85.3 85.5  PLT 176  < > 184 226  < > = values in this interval not displayed. Coagulation:   Recent Labs Lab 11/01/2013 2144  LABPROT 13.1  INR 1.01   Cardiac Enzymes:   Recent Labs Lab 11/20/2013 2144  TROPONINI <0.30   Urinalysis:   Recent Labs Lab 10/31/13 0208  COLORURINE YELLOW  LABSPEC 1.011  PHURINE 7.0  GLUCOSEU NEGATIVE  HGBUR NEGATIVE  BILIRUBINUR NEGATIVE  KETONESUR NEGATIVE  PROTEINUR NEGATIVE  UROBILINOGEN 1.0  NITRITE NEGATIVE  LEUKOCYTESUR NEGATIVE   Lipid Panel    Component Value Date/Time  CHOL 170 11/02/2013 0055   TRIG 210* 11/02/2013 0055   HDL 31* 11/02/2013 0055   CHOLHDL 5.5 11/02/2013 0055   VLDL 42* 11/02/2013 0055   LDLCALC 97 11/02/2013 0055   HgbA1C  Lab Results  Component Value Date   HGBA1C 7.4* 11/01/2013    Urine Drug Screen:     Component Value Date/Time   LABOPIA NONE DETECTED 10/31/2013 0208   COCAINSCRNUR NONE DETECTED 10/31/2013 0208   LABBENZ POSITIVE* 10/31/2013 0208   AMPHETMU NONE DETECTED 10/31/2013 0208   THCU NONE DETECTED 10/31/2013 0208   LABBARB NONE DETECTED 10/31/2013 0208    Alcohol Level:   Recent Labs Lab November 20, 2013 2144  ETH <11    Ct Head Wo Contrast 11/01/2013    New focus of low attenuation in the medial left temporal lobe, concerning for acute/ subacute infarct.    Dg Chest Port 1 View 11/01/2013    Increased diffuse interstitial infiltrates suspicious for mild interstitial edema. Stable  cardiomegaly.     MRI MRA of the brain  - cannot be performed secondary to implantable defibrillator  2D Echocardiogram  ejection fraction 25-30%. No cardiac source of emboli identified.  Carotid Doppler  Carotid Duplex (Doppler) has been completed. Preliminary findings: Bilateral: 1-39% ICA stenosis. Vertebral artery flow is antegrade  EKG  pending  Therapy Recommendations pending  Physical Exam   GENERAL EXAM: LAYING IN BED, WITH BIPAP. LABORED BREATHING.   CARDIOVASCULAR: Regular rate and rhythm, no murmurs, no carotid bruits  NEUROLOGIC: MENTAL STATUS: UNRESPONSIVE. EYES CLOSED. NO EYE OPENING. NO VERBAL. NOT FOLLOWING COMMANDS.  CRANIAL NERVE: RIGHT PUPIL MIN RXN; LEFT PUPIL AND FIXED.  MOTOR/SENS: RIGHT ARM/LEG FLACCID. LEFT ARM DECEREBRATE POSTURING TO STIM. LLE WITHDRAWS TO STIM.    ASSESSMENT [name] is a 57 y.o. male presenting with left gaze deviation, "contortions" of the left side, and mild right upper extremity drift. TPA not given secondary to late presentation. CT scan 11/01/2013 shows a new focus of low attenuation in the medial left temporal lobe, concerning for acute/ subacute infarct. Infarct felt to be embolic of unknown etiology. On aspirin 81 mg orally every day prior to admission. Now on clopidogrel 75 mg orally every day for secondary stroke prevention. Patient with resultant right hemiparesis with residual left hemiparesis from previous CVA.  Work up underway.   Suspected seizure activity - now on Dilantin  Leukocytosis - question aspiration - on Zosyn - currently n.p.o. - patient is not able to cooperate with speech therapy swallowing evaluation.  Hypertension   Left ventricular dysfunction - ejection fraction 25-30% - CHF history  Diabetes mellitus - Global A1c 7.4  Previous stroke with left hemiparesis  Hyperlipidemia history  Increased lethargy noted 11/01/2013 - and Valium discontinued and pain medications  decreased.  Hospital day # 4  TREATMENT/PLAN - Now with pupillary / neuro changes suggesting increased cerebral edema and possible left uncal herniation. Will need to discuss with primary team, palliative care and family to confirm goals of care. Poor prognosis.   Suanne Marker, MD 11/03/2013, 12:48 PM Certified in Neurology, Neurophysiology and Neuroimaging [facility] - Stroke Team  Please refer to [email or web address] for on-call Stroke MD

## 2013-11-03 NOTE — Progress Notes (Signed)
Patient Peter Long      DOB: 1956-01-28      WUJ:[id number]   Discussed case with nursing earlier. After AICD was deactivated low dose dilaudid and ativan drips started.  I authorized removal of bipap when patient comfortable.  Return to see and comfort family.  Adjusted patient and bolused dilaudid for increase work of breathing.  Patient without cognition, attempted to minimize snoring by adjusting head.  Provided Kids path brochure for 57 yr old [place] , granddaughter, and counseling and bereavement information for family after his death. Will recheck in a little while.  Total time 25 min  Eaden Hettinger L. Ladona Ridgel, MD MBA The Palliative Medicine Team at [facility] Phone: [phone] Pager: [phone]

## 2013-11-03 NOTE — Progress Notes (Addendum)
Patient [name]      DOB: 1956/07/04      WUJ:[id number]  Noted Neurology consult .  Spoke with family, full comfort to be instituted with discontinuation of bipap once comfortable.  Need to get AICD turned off and then will proceed. Can't fully predict the time course for death in light of his neurologic injury.  Patient now showing signs of possible intracranial pressure which may explain the increased work of breathing.  Family asserting Peter Long's wishes not to have his life prolonged in his current state.  Plan to deactivate AICD then initiate Dilaudid infusion for comfort and remove Bipap.  Will also use a low dose ativan drip because of significant potential for seizures and his long history of opiate medication use in high quantities.  Discussed case with Dr. Anne Fu and Dr. Lewayne Bunting Chaplain has been called.  Total time 200 pm-247 pm  Cady Hafen L. Ladona Ridgel, MD MBA The Palliative Medicine Team at [facility] Phone: [phone] Pager: [phone]

## 2013-11-03 NOTE — Progress Notes (Signed)
Bipap removed

## 2013-11-03 NOTE — Progress Notes (Signed)
Patient JY:NWGNFAO Peter Long      DOB: 12-30-1955      ZHY:[id number]   Palliative Medicine Team at [facility] Progress Note    Subjective: Patient minimally responsive.  Staff states he sleep some overnight and his abdominal breathing was less.  Currently, paradoxing with respiratory rate in the 30's.  No moaning.  No family at the bedside.  I have left messages for them to call me.  Filed Vitals:   11/03/13 0735  BP: 148/84  Pulse: 116  Temp: 98.7 F (37.1 C)  Resp: 34   Physical exam:  General: minimal distress secondary to bipap PERRL, patient minmally responsive Chest decreased with coarse wet rhonchi CVS: tachycardica Abd: firm, no grimace Ext: warm, no mottling not moving right side. Neuro: impaired cognition and hemiparies seemingly on the right  Lab Results  Component Value Date   CREATININE 1.24 11/03/2013   BUN 24* 11/03/2013   NA 145 11/03/2013   K 3.1* 11/03/2013   CL 100 11/03/2013   CO2 23 11/03/2013   Lab Results  Component Value Date   WBC 29.2* 11/03/2013   HGB 20.2* 11/03/2013   HCT 55.6* 11/03/2013   MCV 85.5 11/03/2013   PLT 226 11/03/2013     Assessment and plan: 57 yr old white male admitted with altered mental status , felt to possibly have had a seizure; CT initailly negative for stroke but subsequent CT shows new stroke. Course complicated by respiratory failure and rising white count.  Patient on zosyn but WBC up from 17 to 29.  Family split on course of treatment.  Spouse authorized DNR but back peddled on curative treatments when his daughter became angry regarding "just giving up on him".  He has been on bipap overnight .  Will remeet with them today as he is worsening and not improving need to consider comfort care.  1.  DNR/ DNI  On bipap for now  2.  Elevated white count: consider broadening coverage on antibiotics until family can work through goals of care  3.  Hypokalemia with AICD in place.  Would consider repletion until aicd  turned off.  I have left message for wife to call me so we can see if I can deactivate AICD until then have advised nursing to [place] magnet if he gets shock.   Total time 20 min  Peter Marrin L. Ladona Ridgel, MD MBA The Palliative Medicine Team at [facility] Phone: [phone] Pager: [phone]

## 2013-11-03 NOTE — Progress Notes (Signed)
ANTIBIOTIC CONSULT NOTE - Follow up   Pharmacy Consult for Zosyn and Vancomycin  Indication: aspiration  Allergies  Allergen Reactions  . Morphine Other (See Comments)    Aggitation, Meanness.     Patient Measurements: Height: 6\' 1"  (185.4 cm) Weight: 226 lb 10.1 oz (102.8 kg) IBW/kg (Calculated) : 79.9   Vital Signs: Temp: 98.7 F (37.1 C) (12/14 0735) Temp src: Axillary (12/14 0735) BP: 141/88 mmHg (12/14 0834) Pulse Rate: 119 (12/14 0834) Intake/Output from previous day: 12/13 0701 - 12/14 0700 In: 150 [IV Piggyback:150] Out: 2485 [Urine:2485] Intake/Output from this shift:    Labs:  Recent Labs  11/01/13 0626 11/02/13 0055 11/03/13 0415  WBC 13.4* 17.9* 29.2*  HGB 16.4 18.5* 20.2*  PLT 180 184 226  CREATININE 0.92  --  1.24   Estimated Creatinine Clearance: 81.8 ml/min (by C-G formula based on Cr of 1.24). No results found for this basename: VANCOTROUGH, VANCOPEAK, VANCORANDOM, GENTTROUGH, GENTPEAK, GENTRANDOM, TOBRATROUGH, TOBRAPEAK, TOBRARND, AMIKACINPEAK, AMIKACINTROU, AMIKACIN,  in the last 72 hours   Assessment: 7 YOM admitted with likely seizure. Now more lethargic and with SOB- suspected aspiration PNA. Started on Zosyn yesterday. WBC trended up today to 29.9, Tmax/24hr 100.2, CrCl~ 81 mL/min. Vancomycin added for worsening leukocytosis.   Goal of Therapy:  Eradication of infection Vancomycin trough goal: 15-20  Plan:  1. Zosyn 3.375gm IV q8h extended interval dosing 2. Vancomycin loading dose of 2500 mg x 1 dose, followed by 1000 mg Q 12 hours  3. Follow c/s, renal function, clinical progression, fever curve, LOT 4. Vancomycin trough at steady state   Vinnie Level, PharmD.  Clinical Pharmacist Pager [phone]

## 2013-11-03 NOTE — Progress Notes (Signed)
Family at bedside.  AICD was turned off. Started Dilaudid and Ativan gtts per order. Will continue to monitor.

## 2013-11-03 NOTE — Progress Notes (Signed)
Chaplain was paged by doctor to be present with family.  Family was making the decision to put pt on comfort care and needed emotional and grief support.  Chaplain came to provide that support and also be a ministry of presence to the family.  Family seemed grateful for the visit and asked if chaplain could return later when they remove breathing support.  Chaplain agreed and is awaiting follow-up.

## 2013-11-03 NOTE — Progress Notes (Signed)
[facility] PROGRESS NOTE  Peter Long:[id number] DOB: [birth year] DOA: 10/31/2013 PCP: Kirk Ruths, MD  Assessment/Plan: Respiratory distress- will increase dilaudid as patient having increased HR and appears uncomfortable-discussed with family at bedside- brother and sister -appreciate Dr. Ladona Ridgel  Possible seizures with new CVA- initially given Keppra but changed to dilantin by the neurologist. EEG did not show seizres. -repeat head CT : positive for CVA- unable to have MRI due to pacemaker - not following commands  Acute encephalopathy - possibly post ictal. Closely follow -most likely due to CVA.   Mild leukocytosis - trend, U/A and chest x ray not representative of infection but more fluid ?aspiration-  zosyn/vanc  Diabetes mellitus type 2 - patient is on Lantus. Closely follow CBGs.    History of nonischemic cardiomyopathy status post AICD placement - closely follow intake output and metabolic panel.  EF decreased  History of CVA with left-sided hemiparesis - on aspirin.   Chronic pain - hold sedating medications  Hypokalemia -replete D/c lasix  Code Status: full Family Communication: son/wife at bedside for palliative care  Disposition Plan:    Consultants:  neuro  Procedures:  EEg  Antibiotics:    HPI/Subjective: Increasing work of breathing   Objective: Filed Vitals:   11/03/13 0834  BP: 141/88  Pulse: 119  Temp:   Resp: 36    Intake/Output Summary (Last 24 hours) at 11/03/13 1059 Last data filed at 11/03/13 0700  Gross per 24 hour  Intake    150 ml  Output   1485 ml  Net  -1335 ml   Filed Weights   11/01/13 0500 11/02/13 0500 11/03/13 0323  Weight: 109.77 kg (242 lb) 104.554 kg (230 lb 8 oz) 102.8 kg (226 lb 10.1 oz)    Exam:  General: chronically ill appearing, respiratory distress with bipap  Cardiovascular: tachy.  Respiratory: coarse breath sounds  Abdomen: Soft nontender bowel sounds present.  Skin: No  rash. Chronic skin bruises.  Musculoskeletal: No edema.  Psychiatric: not responsive.  Neurologic: not responsive   Data Reviewed: Basic Metabolic Panel:  Recent Labs Lab 10/28/2013 2144 11/08/2013 2150 10/31/13 0551 11/01/13 0626 11/03/13 0415  NA 138 142 139 137 145  K 3.8 3.9 3.7 3.7 3.1*  CL 102 101 103 101 100  CO2 30  --  24 25 23   GLUCOSE 154* 150* 140* 109* 167*  BUN 9 8 9 8  24*  CREATININE 0.96 1.20 0.89 0.92 1.24  CALCIUM 9.0  --  8.9 9.2 10.0   Liver Function Tests:  Recent Labs Lab 10/31/2013 2144 10/31/13 0551 11/01/13 0626 11/02/13 0055  AST 18 15 15   --   ALT 13 12 11   --   ALKPHOS 148* 135* 136*  --   BILITOT 0.5 0.4 0.6  --   PROT 6.8 6.5 6.8  --   ALBUMIN 3.6 3.3* 3.6 4.0   No results found for this basename: LIPASE, AMYLASE,  in the last 168 hours  Recent Labs Lab 11/12/2013 2144 10/31/13 1330  AMMONIA RESULTS UNAVAILABLE DUE TO INTERFERING SUBSTANCE 36   CBC:  Recent Labs Lab 11/09/2013 2144 11/07/2013 2150 10/31/13 0551 11/01/13 0626 11/02/13 0055 11/03/13 0415  WBC 12.2*  --  10.9* 13.4* 17.9* 29.2*  NEUTROABS 8.6*  --  7.1  --  14.5*  --   HGB 17.1* 17.0 16.0 16.4 18.5* 20.2*  HCT 47.8 50.0 46.0 46.1 51.6 55.6*  MCV 85.8  --  85.7 84.0 85.3 85.5  PLT 186  --  176 180 184 226   Cardiac Enzymes:  Recent Labs Lab 11/07/2013 2144  TROPONINI <0.30   BNP (last 3 results)  Recent Labs  11/11/12 1408  PROBNP 210.4*   CBG:  Recent Labs Lab 11/02/13 1649 11/02/13 1943 11/02/13 2302 11/03/13 0320 11/03/13 0728  GLUCAP 137* 142* 167* 155* 213*    Recent Results (from the past 240 hour(s))  MRSA PCR SCREENING     Status: None   Collection Time    11/02/13  2:48 PM      Result Value Range Status   MRSA by PCR NEGATIVE  NEGATIVE Final   Comment:            The GeneXpert MRSA Assay (FDA     approved for NASAL specimens     only), is one component of a     comprehensive MRSA colonization     surveillance program. It is not      intended to diagnose MRSA     infection nor to guide or     monitor treatment for     MRSA infections.     Studies: Ct Head Wo Contrast  11/01/2013   CLINICAL DATA:  Stroke.  Decreased level of consciousness.  EXAM: CT HEAD WITHOUT CONTRAST  TECHNIQUE: Contiguous axial images were obtained from the base of the skull through the vertex without intravenous contrast.  COMPARISON:  11/02/2013 and earlier  FINDINGS: There is an approximately 2.0 x 1.1 cm focus of low attenuation within the medial left temporal lobe (series 2, image 12) which was not present on multiple prior examinations. Areas of encephalomalacia corresponding to remote infarcts are again seen in the right temporal, parietal, and occipital lobes. Small amount of mineralization within the right parietal infarct is unchanged. There is ex vacuo dilatation of the right lateral ventricle. Diffuse prominence of the ventricles and sulci is compatible with moderate cerebral atrophy, advanced for age. There is no evidence of acute intracranial hemorrhage, mass, midline shift, or extra-axial fluid collection. Periventricular white matter hypodensities are compatible with moderate chronic small vessel ischemic disease. The orbits are unremarkable. The visualized paranasal sinuses and mastoid air cells are clear.  IMPRESSION: New focus of low attenuation in the medial left temporal lobe, concerning for acute/ subacute infarct.  These results will be called to the ordering clinician or representative by the Radiologist Assistant, and communication documented in the PACS Dashboard.   Electronically Signed   By: Sebastian Ache   On: 11/01/2013 12:45   Dg Chest Port 1 View  11/02/2013   CLINICAL DATA:  Shortness of breath. Indwelling pacing defibrillator. Followup mild interstitial pulmonary edema identified yesterday. Current history of coronary artery disease, diabetes, hyperlipidemia, hypertension, and COPD.  EXAM: PORTABLE CHEST - 1 VIEW   COMPARISON:  Portable examinations yesterday, 12/10/1,014, 03/26/2012.  FINDINGS: Interval resolution of the interstitial pulmonary edema. Baseline prominent bronchovascular markings diffusely and moderate central peribronchial thickening, unchanged. Lungs otherwise clear. No localized airspace consolidation. No pleural effusions. No pneumothorax. Normal pulmonary vascularity. Cardiac silhouette moderately enlarged but stable. Right subclavian pacing defibrillator unchanged and appears intact.  IMPRESSION: 1. Resolution of interstitial pulmonary edema since yesterday. No acute cardiopulmonary disease currently. 2. Stable baseline moderate changes of chronic bronchitis. 3. Stable cardiomegaly.   Electronically Signed   By: Hulan Saas M.D.   On: 11/02/2013 14:17   Dg Chest Port 1 View  11/01/2013   CLINICAL DATA:  Shortness of breath.  Coronary artery disease.  EXAM: PORTABLE CHEST - 1 VIEW  COMPARISON:  2013-10-31  FINDINGS: Mild cardiomegaly appears stable. Increased interstitial infiltrate seen bilaterally, suspicious for mild edema. No evidence of pulmonary airspace disease or consolidation. No evidence of pleural effusion.  AICD remains in stable position, with leads overlying the right ventricle and left subclavian vein.  IMPRESSION: Increased diffuse interstitial infiltrates suspicious for mild interstitial edema. Stable cardiomegaly.   Electronically Signed   By: Myles Rosenthal M.D.   On: 11/01/2013 19:04    Scheduled Meds: . enoxaparin (LOVENOX) injection  40 mg Subcutaneous Q24H  . insulin aspart  0-9 Units Subcutaneous Q4H  . insulin glargine  30 Units Subcutaneous QHS  . phenytoin (DILANTIN) IV  100 mg Intravenous Q8H  . piperacillin-tazobactam (ZOSYN)  IV  3.375 g Intravenous Q8H  . sodium chloride  3 mL Intravenous Q12H  . vancomycin  2,500 mg Intravenous Once  . [START ON 11/07/2013] vancomycin  1,000 mg Intravenous Q12H   Continuous Infusions:    Principal Problem:    Seizure Active Problems:   UNSPECIFIED SECONDARY CARDIOMYOPATHY   CVA WITH LEFT HEMIPARESIS   ICD-St.Jude   Type 2 diabetes mellitus   Seizures    Time spent: 35 min    Nettie Cromwell  [facility] Pager [phone]. If 7PM-7AM, please contact night-coverage at [email or web address], password [facility] 11/03/2013, 10:59 AM  LOS: 4 days

## 2013-11-03 NOTE — Progress Notes (Signed)
Patient ID: KINNEY SACKMANN, male   DOB: 1956-01-19, 57 y.o.   MRN: [id number] Chart reviewed. Patient known to me remotely from clinic visits who presented with another stroke and now ventricular herniation. We have been asked to turn off tachy therapies for his ICD. I agree with this decision and will plan to proceed. I would not expect him to develop ventricular arrhythmias at this point however.  Leonia Reeves.D

## 2013-11-04 MED ORDER — STERILE WATER FOR IRRIGATION IR SOLN
Freq: Once | Status: AC
  Administered 2013-11-04: 11:00:00

## 2013-11-08 LAB — CULTURE, BLOOD (ROUTINE X 2): Culture: NO GROWTH

## 2013-11-21 NOTE — Progress Notes (Signed)
aucultated for apical pulse  x90min by dawn fields rn and Kailea Dannemiller rn with no pulse present. Magnet applied to pacemaker. Pt asystole. Notified dr Benjamine Mola.

## 2013-11-21 NOTE — Progress Notes (Signed)
At request of pt's family, this RN performed very limited assessment (see flowsheets). Comfort/empathy provided to pt's family. Pt currently unresponsive, but appears comfortable with Dilaudid and Ativan gtts. Will continue to monitor and assess.

## 2013-11-21 NOTE — Progress Notes (Signed)
Wasted dilaudid gtt and ativan gtt in sink witness Robyn Wofford rn and Lesly Joslyn rn.

## 2013-11-21 NOTE — Discharge Summary (Addendum)
Death Summary  Peter Long XBM:[id number] DOB: Apr 01, 1956 DOA: Nov 22, 2013  PCP: Kirk Ruths, MD   Admit date: 11/22/13 Date of Death: 2013-11-27  Final Diagnoses:  Principal Problem:   Seizure Active Problems:   UNSPECIFIED SECONDARY CARDIOMYOPATHY   CVA WITH LEFT HEMIPARESIS   ICD-St.Jude   Type 2 diabetes mellitus   Seizures      History of present illness:  Peter Long is a 58 y.o. male with history of CVA with left-sided hemiplegia, nonischemic cardiomyopathy status post AICD placement, hypertension and hyperlipidemia and diabetes mellitus was found to be confused and had tightening of his left extremity as witnessed by patient's son. Patient's son noticed these signs around 6:45 PM. Patient's face and eyes were drawn towards the left. Patient was not responding well to questions. Looked completely confused. EMS was called and patient was brought to the ER. CT head did not show anything acute. Neurologist on-call was consulted and patient was started on Keppra IV for possible seizures. By the time patient had reached ER patient's symptoms had improved. Patient is presently confused. 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

## 2013-11-21 DEATH — deceased

## 2015-07-09 IMAGING — CT CT ABD-PELV W/O CM
2 of 4 series · 16 of 46 positions shown, 18 images · non-contrast
Comparison: Abdomen CT 03/16/2012.

CLINICAL DATA: Left lower quadrant pain x2 weeks. Generalized
weakness.

EXAM:
CT ABDOMEN AND PELVIS WITHOUT CONTRAST
TECHNIQUE: Multidetector CT imaging of the abdomen and pelvis was performed
following the standard protocol without intravenous contrast.

[Series 2: abdomen/pelvis w/o contrast · axial · non-contrast · 0.85mm/px · z∈[-492,-32]mm · 13 of 102 slices shown, 15 images]
[im 5/102  soft-tissue]
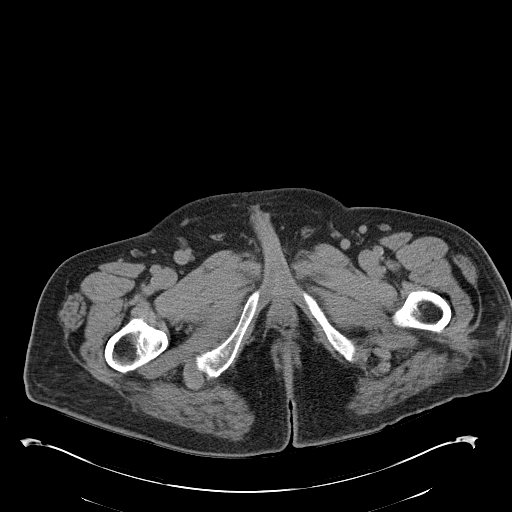
[im 5/102  bone]
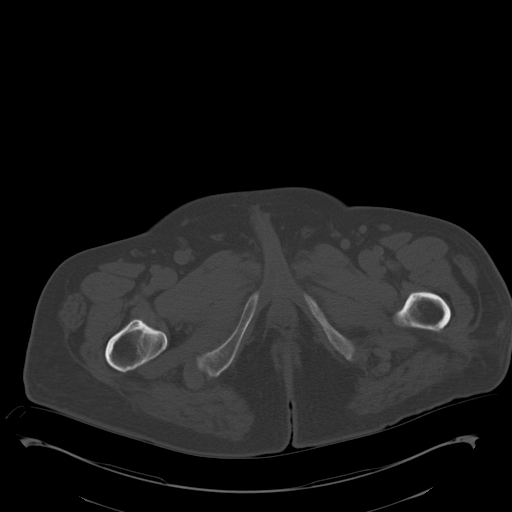
[im 13/102  soft-tissue]
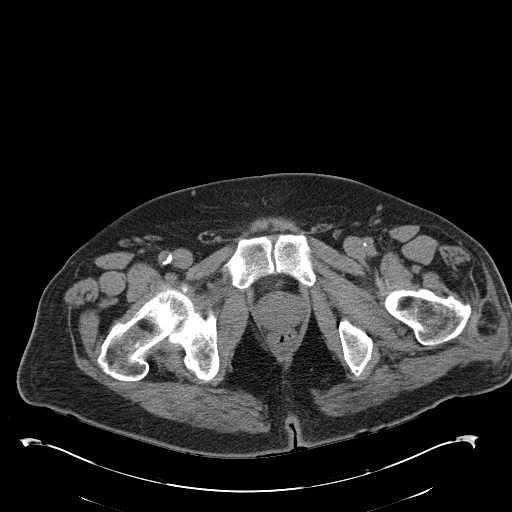
[im 21/102  soft-tissue]
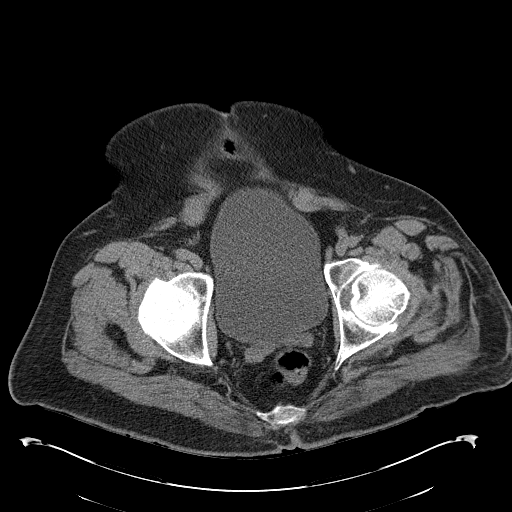
[im 29/102  soft-tissue]
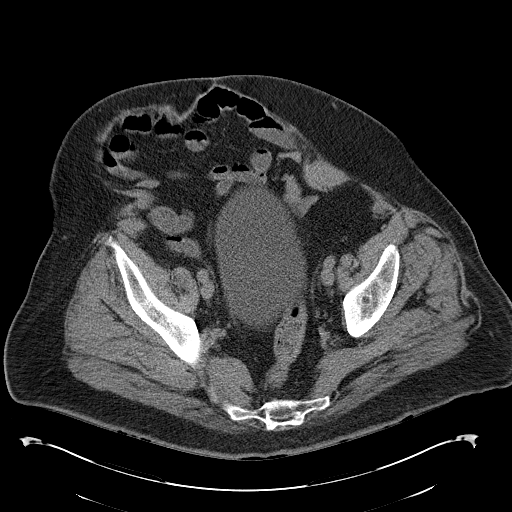
[im 37/102  soft-tissue]
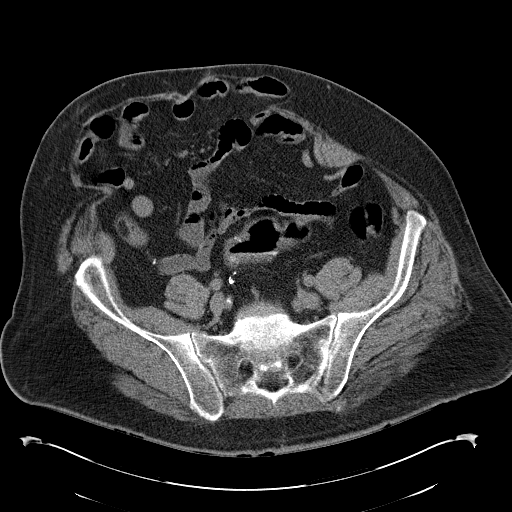
[im 45/102  soft-tissue]
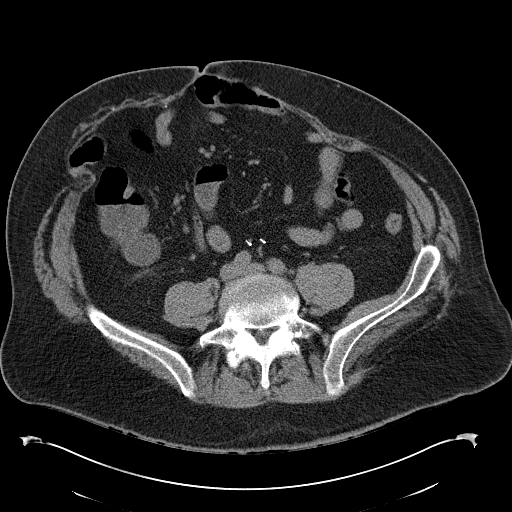
[im 53/102  soft-tissue]
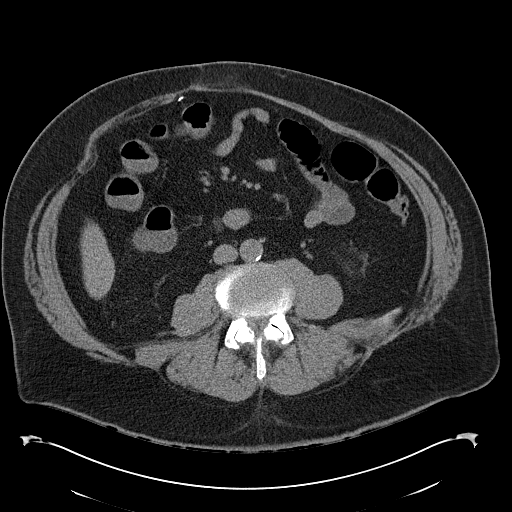
[im 57/102  soft-tissue]
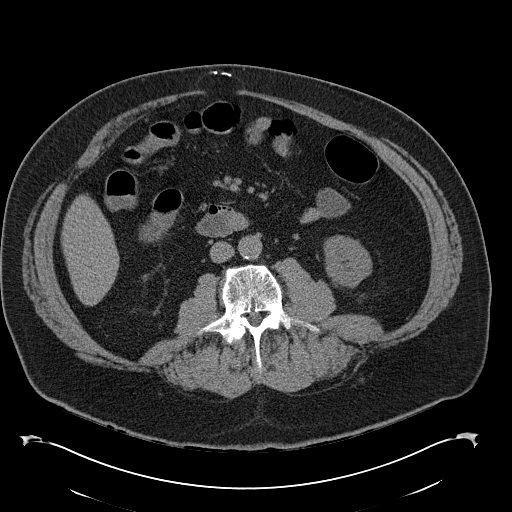
[im 65/102  soft-tissue]
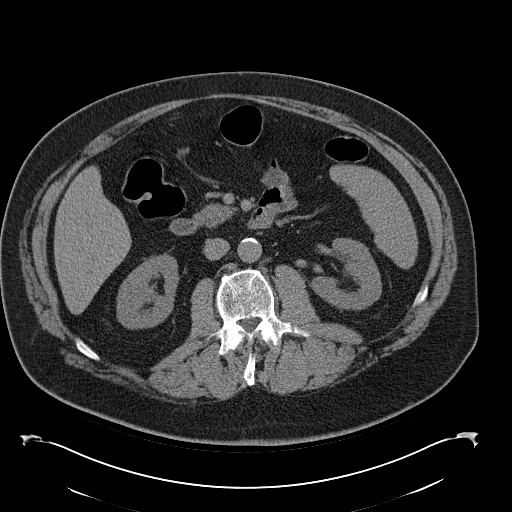
[im 65/102  bone]
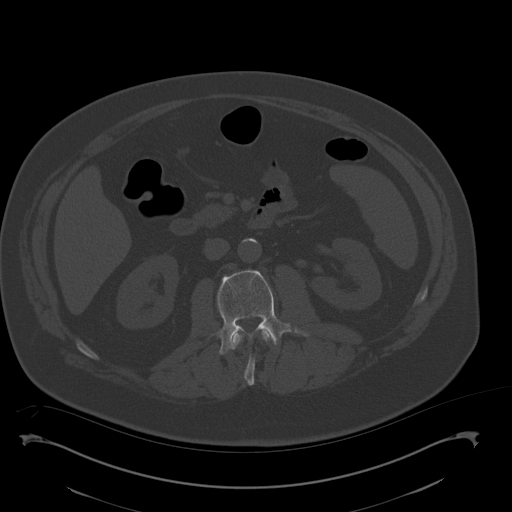
[im 73/102  soft-tissue]
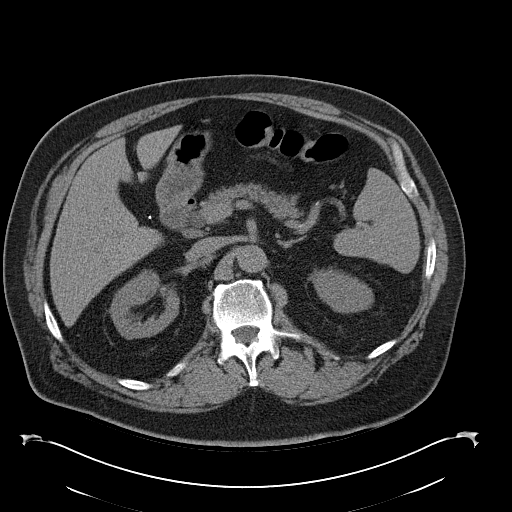
[im 81/102  soft-tissue]
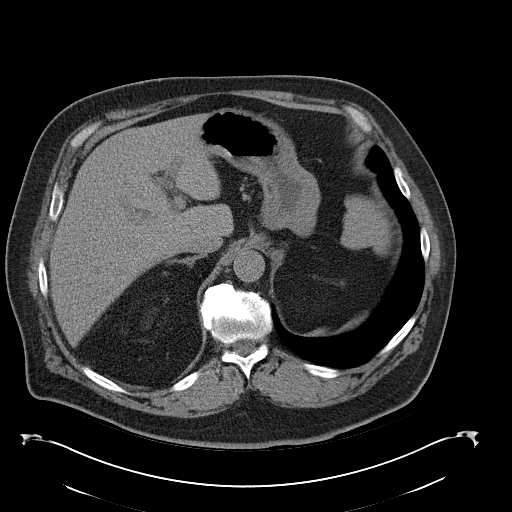
[im 89/102  soft-tissue]
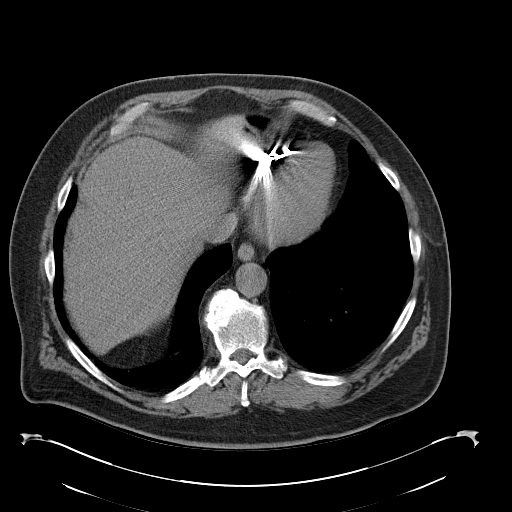
[im 97/102  soft-tissue]
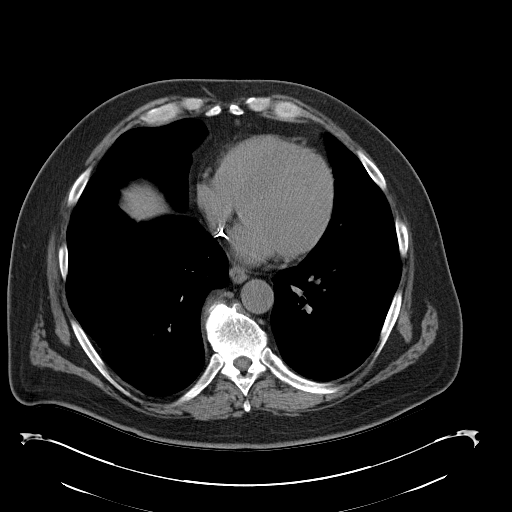

[Series 4: mpr cor (id) · coronal · 0.81mm/px · 3 of 106 slices shown]
[im 36/106  soft-tissue]
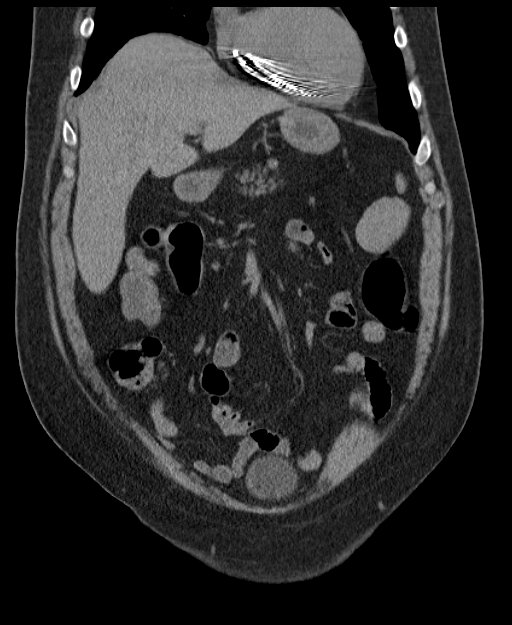
[im 47/106  soft-tissue]
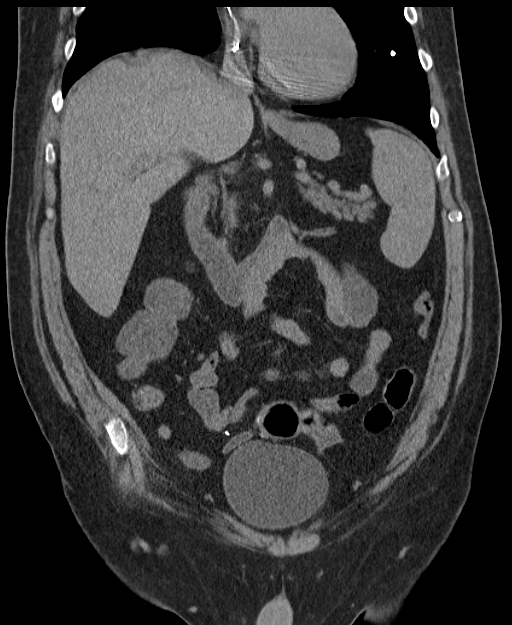
[im 59/106  soft-tissue]
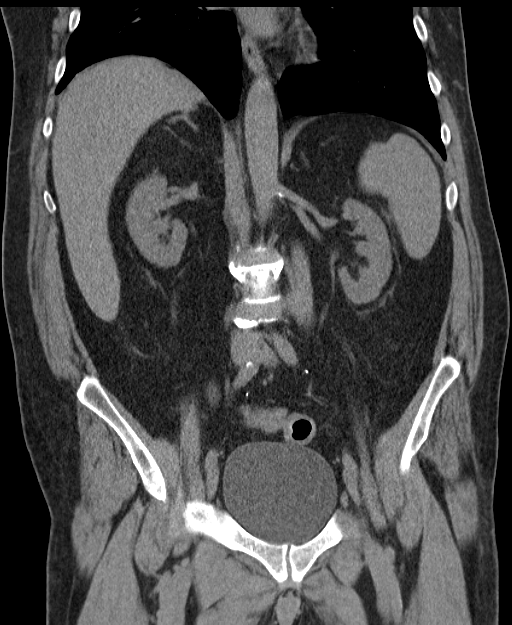

[16 of 46 positions shown; findings below may reference images not displayed]

FINDINGS: Visualization of the lower thorax demonstrates minimal dependent
atelectasis within the right lower lobe. Calcified pulmonary nodules
within the lingula and left lower lobe. Normal heart size. No
pericardial effusion.

Lack of intravenous contrast material limits evaluation of the solid
organ parenchyma. Liver is diffusely low in attenuation. Status post
cholecystectomy. The spleen, pancreas and bilateral adrenal glands
are unremarkable. The kidneys are symmetric in size. No
hydronephrosis. Unchanged mild perinephric fat stranding,
nonspecific. Grossly similar sub cm low-attenuation lesion within
the interpolar region of the left kidney. There is a 3 mm
nonobstructing calculus within the interpolar region of the right
kidney.

Normal caliber abdominal aorta with scattered calcified
atherosclerotic plaque. Urinary bladder is grossly unremarkable.

Persistent wide mouth ventral abdominal wall hernia containing
nonobstructed loops of small bowel. Overall this is grossly similar
in appearance when compared to recent prior examination.
Postsurgical change involving the colon. No abnormal bowel wall
thickening. No evidence for bowel obstruction. No free fluid or free
intraperitoneal air.

Small amount of fluid, fat stranding and blood products within the
subcutaneous fat overlying the left greater trochanter. There is a
minimally displaced fracture of left L2 transverse process.
IMPRESSION: 1. Minimally displaced fracture through the left L2 transverse
process.
2. Small amount of subcutaneous fat stranding and blood products at
the level of the left greater trochanter.
3. No significant interval change in size or appearance of complex
ventral abdominal wall hernia containing nonobstructed loops of
small bowel.
4. Hepatic steatosis.
5. Nephrolithiasis right kidney.
